# Patient Record
Sex: Female | Born: 1980 | Race: Black or African American | Hispanic: No | Smoking: Smoker, current status unknown
Health system: Southern US, Community
[De-identification: ages and names within clinical notes are randomized; demographics above are authoritative.]

## PROBLEM LIST (undated history)

## (undated) DIAGNOSIS — Z789 Other specified health status: Secondary | ICD-10-CM

## (undated) DIAGNOSIS — L0390 Cellulitis, unspecified: Secondary | ICD-10-CM

## (undated) HISTORY — PX: CHOLECYSTECTOMY: SHX55

---

## 2014-09-11 ENCOUNTER — Emergency Department: Payer: Self-pay | Admitting: Emergency Medicine

## 2015-04-22 ENCOUNTER — Encounter: Payer: Self-pay | Admitting: Emergency Medicine

## 2015-04-22 ENCOUNTER — Emergency Department: Payer: Self-pay

## 2015-04-22 ENCOUNTER — Emergency Department
Admission: EM | Admit: 2015-04-22 | Discharge: 2015-04-22 | Disposition: A | Discharge status: Home / Self Care | Payer: Self-pay | Attending: Emergency Medicine | Admitting: Emergency Medicine

## 2015-04-22 DIAGNOSIS — L03116 Cellulitis of left lower limb: Secondary | ICD-10-CM | POA: Insufficient documentation

## 2015-04-22 DIAGNOSIS — Z720 Tobacco use: Secondary | ICD-10-CM | POA: Insufficient documentation

## 2015-04-22 LAB — BASIC METABOLIC PANEL
Anion gap: 6 (ref 5–15)
BUN: 10 mg/dL (ref 6–20)
CALCIUM: 8.4 mg/dL — AB (ref 8.9–10.3)
CHLORIDE: 105 mmol/L (ref 101–111)
CO2: 25 mmol/L (ref 22–32)
Creatinine, Ser: 0.64 mg/dL (ref 0.44–1.00)
GFR calc non Af Amer: >60 mL/min (ref >60)
Glucose, Bld: 98 mg/dL (ref 65–99)
POTASSIUM: 3.8 mmol/L (ref 3.5–5.1)
Sodium: 136 mmol/L (ref 135–145)

## 2015-04-22 LAB — CBC WITH DIFFERENTIAL/PLATELET
BASOS PCT: 1 %
Basophils Absolute: 0.1 10*3/uL (ref 0–0.1)
EOS ABS: 0.4 10*3/uL (ref 0–0.7)
Eosinophils Relative: 4 %
HEMATOCRIT: 45.5 % (ref 35.0–47.0)
HEMOGLOBIN: 14.6 g/dL (ref 12.0–16.0)
Lymphocytes Relative: 17 %
Lymphs Abs: 1.5 10*3/uL (ref 1.0–3.6)
MCH: 27.6 pg (ref 26.0–34.0)
MCHC: 32.1 g/dL (ref 32.0–36.0)
MCV: 85.9 fL (ref 80.0–100.0)
MONO ABS: 0.9 10*3/uL (ref 0.2–0.9)
MONOS PCT: 10 %
NEUTROS PCT: 68 %
Neutro Abs: 6.1 10*3/uL (ref 1.4–6.5)
PLATELETS: 190 10*3/uL (ref 150–440)
RBC: 5.3 MIL/uL — ABNORMAL HIGH (ref 3.80–5.20)
RDW: 15.5 % — ABNORMAL HIGH (ref 11.5–14.5)
WBC: 9 10*3/uL (ref 3.6–11.0)

## 2015-04-22 MED ORDER — CLINDAMYCIN HCL 300 MG PO CAPS: 300.0000 mg | ORAL_CAPSULE | Freq: Four times a day (QID) | ORAL | Status: AC

## 2015-04-22 MED ORDER — CLINDAMYCIN PHOSPHATE 600 MG/50ML IV SOLN
INTRAVENOUS | Status: AC
  Administered 2015-04-22: 600 mg via INTRAVENOUS
  Filled 2015-04-22: qty 50

## 2015-04-22 MED ORDER — IBUPROFEN 800 MG PO TABS: 800.0000 mg | ORAL_TABLET | Freq: Three times a day (TID) | ORAL | Status: DC | PRN

## 2015-04-22 MED ORDER — CLINDAMYCIN PHOSPHATE 600 MG/50ML IV SOLN
600.0000 mg | Freq: Once | INTRAVENOUS | Status: AC
  Administered 2015-04-22: 600 mg via INTRAVENOUS

## 2015-04-22 NOTE — ED Provider Notes (Signed)
CSN: [id number]     Arrival date & time 04/22/15  1147 History   First MD Initiated Contact with Patient 04/22/15 1205     Chief Complaint  Patient presents with  . Leg Swelling     (Consider location/radiation/quality/duration/timing/severity/associated sxs/prior Treatment) HPI  Patient has a 4 day history of redness to her left leg states that she went to her doctor 3 days ago was placed on an antibiotic that she couldn't afford she purchased 3 pills and took one a day she notes that they're supposed to be taken much more frequently than that and says that the redness has seemed to gotten worse she denies any fevers or chills rates the pain as about a 5 out of 10 at its worst and has no other complaints at this time   No past medical history on file. Past Surgical History  Procedure Laterality Date  . Cholecystectomy     No family history on file. History  Substance Use Topics  . Smoking status: Smoker, Current Status Unknown  . Smokeless tobacco: Not on file  . Alcohol Use: Yes   OB History    No data available     Review of Systems  Constitutional: Negative.   HENT: Negative.   Eyes: Negative.   Respiratory: Negative.   Cardiovascular: Negative.   Musculoskeletal: Negative.   Neurological: Negative.   All other systems reviewed and are negative.     Allergies  Shellfish allergy  Home Medications   Prior to Admission medications   Medication Sig Start Date End Date Taking? Authorizing Provider  clindamycin (CLEOCIN) 300 MG capsule Take 1 capsule (300 mg total) by mouth 4 (four) times daily. 04/22/15   Darrel Gloss William C Tineshia Becraft, PA-C  ibuprofen (ADVIL,MOTRIN) 800 MG tablet Take 1 tablet (800 mg total) by mouth every 8 (eight) hours as needed. 04/22/15   Keirston Saephanh William C Carrera Kiesel, PA-C   BP 130/100 mmHg  Pulse 77  Temp(Src) 97.8 F (36.6 C) (Oral)  Resp 14  Ht 5\' 5"  (1.651 m)  Wt 316 lb (143.337 kg)  BMI 52.59 kg/m2  SpO2 98%  LMP 03/22/2015 Physical  Exam  Morbidly obese female appearing stated age Well-developed well-nourished and in no acute distress Vitals reviewed Head ears eyes nose neck and throat examination patient unremarkable Cardiovascular regular rate and rhythm no murmurs rubs gallops Pulmonary lungs clear to auscultation bilaterally Muscular skeletal she able to move all extremities without difficulty Skin the patient has a large warm erythematous area on her medial left calf spreading up past her knee to her lower thigh no area of fluctuance no focal abscess good distal pulses and cap refill of her left leg Neuro exams nonfocal cranial nerves II through XII grossly intact   ED Course  Procedures (including critical care time) Labs Review Labs Reviewed  BASIC METABOLIC PANEL - Abnormal; Notable for the following:    Calcium 8.4 (*)    All other components within normal limits  CBC WITH DIFFERENTIAL/PLATELET - Abnormal; Notable for the following:    RBC 5.30 (*)    RDW 15.5 (*)    All other components within normal limits  CULTURE, BLOOD (ROUTINE X 2)  CULTURE, BLOOD (ROUTINE X 2)    Imaging Review US Venous Img Lower Unilateral Left  04/22/2015   CLINICAL DATA:  Left leg swelling for 1 week. Associated pain and discoloration. No history of DVT. Initial encounter.  EXAM: LEFT LOWER EXTREMITY VENOUS DOPPLER ULTRASOUND  TECHNIQUE: Gray-scale sonography with graded compression,  as well as color Doppler and duplex ultrasound were performed to evaluate the lower extremity deep venous systems from the level of the common femoral vein and including the common femoral, femoral, profunda femoral, popliteal and calf veins including the posterior tibial, peroneal and gastrocnemius veins when visible. The superficial great saphenous vein was also interrogated. Spectral Doppler was utilized to evaluate flow at rest and with distal augmentation maneuvers in the common femoral, femoral and popliteal veins.  COMPARISON:  None.   FINDINGS: Contralateral Common Femoral Vein: Respiratory phasicity is normal and symmetric with the symptomatic side. No evidence of thrombus. Normal compressibility.  Common Femoral Vein: No evidence of thrombus. Normal compressibility, respiratory phasicity and response to augmentation.  Saphenofemoral Junction: No evidence of thrombus. Normal compressibility and flow on color Doppler imaging.  Profunda Femoral Vein: No evidence of thrombus. Normal compressibility and flow on color Doppler imaging.  Femoral Vein: No evidence of thrombus. Normal compressibility, respiratory phasicity and response to augmentation.  Popliteal Vein: No evidence of thrombus. Normal compressibility, respiratory phasicity and response to augmentation.  Calf Veins: Limited evaluation.  No gross abnormality.  Superficial Great Saphenous Vein: No evidence of thrombus. Normal compressibility and flow on color Doppler imaging.  Other Findings:  None.  IMPRESSION: No evidence of acute DVT in the left lower extremity. Examination is mildly limited by body habitus.   Electronically Signed   By: Carey Bullocks M.D.   On: 04/22/2015 13:48   ED course patient was given 600 mg clindamycin IV labs and ultrasound were reviewed with the patient   MDM   Decision making on this patient given that there was no sign of any thrombus in her leg will go and started on IV antibiotic's get her an affordable oral antibiotic have her return in 2 days for wound check return here earlier for any acute concerns or worsening symptoms   Final diagnoses:  Cellulitis of left lower extremity       Alahia Whicker Rosalyn Gess, PA-C 04/22/15 1507  Governor Rooks, MD 04/22/15 1520

## 2015-04-22 NOTE — ED Notes (Signed)
Pt states last Friday she noticed pain in her left lower leg, calf, area, was seen at her primary dr and prescribed antibiotics, pt has pain that radiates to her inner left thigh, with redness and swelling

## 2015-04-22 NOTE — ED Notes (Signed)
Inflamed area left thigh marked with skin marker

## 2015-04-23 ENCOUNTER — Encounter: Payer: Self-pay | Admitting: Emergency Medicine

## 2015-04-23 DIAGNOSIS — Z79899 Other long term (current) drug therapy: Secondary | ICD-10-CM

## 2015-04-23 DIAGNOSIS — E876 Hypokalemia: Secondary | ICD-10-CM | POA: Diagnosis present

## 2015-04-23 DIAGNOSIS — I809 Phlebitis and thrombophlebitis of unspecified site: Principal | ICD-10-CM | POA: Diagnosis present

## 2015-04-23 DIAGNOSIS — Z6841 Body Mass Index (BMI) 40.0 and over, adult: Secondary | ICD-10-CM

## 2015-04-23 DIAGNOSIS — L03116 Cellulitis of left lower limb: Secondary | ICD-10-CM | POA: Diagnosis present

## 2015-04-23 DIAGNOSIS — I8390 Asymptomatic varicose veins of unspecified lower extremity: Secondary | ICD-10-CM | POA: Diagnosis present

## 2015-04-23 DIAGNOSIS — Z9049 Acquired absence of other specified parts of digestive tract: Secondary | ICD-10-CM | POA: Diagnosis present

## 2015-04-23 DIAGNOSIS — Z833 Family history of diabetes mellitus: Secondary | ICD-10-CM

## 2015-04-23 DIAGNOSIS — F17200 Nicotine dependence, unspecified, uncomplicated: Secondary | ICD-10-CM | POA: Diagnosis present

## 2015-04-23 NOTE — ED Notes (Signed)
Was seen here Saturday, dx'd with cellulitis.  On clindamycin, area marked.  Was told to come back if redness crossed line.,  Redness has crossed the line and she is concerned.  Has breached line by 1-1.5 inches in areas.  No open areas, denies fever and chills.  NAD noted.  slighty tachy at 105.  Afebrile at triage.

## 2015-04-24 ENCOUNTER — Inpatient Hospital Stay
Admission: EM | Admit: 2015-04-24 | Discharge: 2015-04-27 | DRG: 300 | Disposition: A | Discharge status: Home / Self Care | Payer: Medicaid Other | Attending: Internal Medicine | Admitting: Internal Medicine

## 2015-04-24 ENCOUNTER — Encounter: Payer: Self-pay | Admitting: Emergency Medicine

## 2015-04-24 DIAGNOSIS — Z720 Tobacco use: Secondary | ICD-10-CM

## 2015-04-24 DIAGNOSIS — L03116 Cellulitis of left lower limb: Secondary | ICD-10-CM | POA: Diagnosis present

## 2015-04-24 DIAGNOSIS — E876 Hypokalemia: Secondary | ICD-10-CM | POA: Diagnosis present

## 2015-04-24 DIAGNOSIS — Z95828 Presence of other vascular implants and grafts: Secondary | ICD-10-CM

## 2015-04-24 DIAGNOSIS — E669 Obesity, unspecified: Secondary | ICD-10-CM

## 2015-04-24 DIAGNOSIS — I809 Phlebitis and thrombophlebitis of unspecified site: Secondary | ICD-10-CM

## 2015-04-24 HISTORY — DX: Other specified health status: Z78.9

## 2015-04-24 HISTORY — DX: Cellulitis, unspecified: L03.90

## 2015-04-24 LAB — CBC
HCT: 41.9 % (ref 35.0–47.0)
Hemoglobin: 14.1 g/dL (ref 12.0–16.0)
MCH: 28.5 pg (ref 26.0–34.0)
MCHC: 33.6 g/dL (ref 32.0–36.0)
MCV: 84.9 fL (ref 80.0–100.0)
PLATELETS: 198 10*3/uL (ref 150–440)
RBC: 4.94 MIL/uL (ref 3.80–5.20)
RDW: 15.4 % — ABNORMAL HIGH (ref 11.5–14.5)
WBC: 11.5 10*3/uL — ABNORMAL HIGH (ref 3.6–11.0)

## 2015-04-24 LAB — COMPREHENSIVE METABOLIC PANEL
ALK PHOS: 77 U/L (ref 38–126)
ALT: 24 U/L (ref 14–54)
AST: 25 U/L (ref 15–41)
Albumin: 3.7 g/dL (ref 3.5–5.0)
Anion gap: 5 (ref 5–15)
BUN: 14 mg/dL (ref 6–20)
CALCIUM: 8.8 mg/dL — AB (ref 8.9–10.3)
CO2: 29 mmol/L (ref 22–32)
Chloride: 105 mmol/L (ref 101–111)
Creatinine, Ser: 0.76 mg/dL (ref 0.44–1.00)
GFR calc Af Amer: >60 mL/min (ref >60)
GFR calc non Af Amer: >60 mL/min (ref >60)
Glucose, Bld: 85 mg/dL (ref 65–99)
POTASSIUM: 3.4 mmol/L — AB (ref 3.5–5.1)
Sodium: 139 mmol/L (ref 135–145)
TOTAL PROTEIN: 7.6 g/dL (ref 6.5–8.1)
Total Bilirubin: 0.6 mg/dL (ref 0.3–1.2)

## 2015-04-24 MED ORDER — POTASSIUM CHLORIDE CRYS ER 20 MEQ PO TBCR
20.0000 meq | EXTENDED_RELEASE_TABLET | Freq: Once | ORAL | Status: DC
  Filled 2015-04-24: qty 1

## 2015-04-24 MED ORDER — OXYCODONE-ACETAMINOPHEN 5-325 MG PO TABS
ORAL_TABLET | ORAL | Status: AC
  Filled 2015-04-24: qty 1

## 2015-04-24 MED ORDER — ONDANSETRON HCL 4 MG/2ML IJ SOLN: 4.0000 mg | Freq: Four times a day (QID) | INTRAMUSCULAR | Status: DC | PRN

## 2015-04-24 MED ORDER — VANCOMYCIN HCL 10 G IV SOLR
1500.0000 mg | Freq: Two times a day (BID) | INTRAVENOUS | Status: DC
  Filled 2015-04-24 (×3): qty 1500

## 2015-04-24 MED ORDER — ONDANSETRON HCL 4 MG PO TABS: 4.0000 mg | ORAL_TABLET | Freq: Four times a day (QID) | ORAL | Status: DC | PRN

## 2015-04-24 MED ORDER — ACETAMINOPHEN 325 MG PO TABS: 650.0000 mg | ORAL_TABLET | Freq: Four times a day (QID) | ORAL | Status: DC | PRN

## 2015-04-24 MED ORDER — SODIUM CHLORIDE 0.9 % IJ SOLN
3.0000 mL | INTRAMUSCULAR | Status: DC | PRN
  Administered 2015-04-25: 3 mL via INTRAVENOUS
  Filled 2015-04-24: qty 10

## 2015-04-24 MED ORDER — SODIUM CHLORIDE 0.9 % IJ SOLN
3.0000 mL | Freq: Two times a day (BID) | INTRAMUSCULAR | Status: DC
  Administered 2015-04-24 – 2015-04-27 (×7): 3 mL via INTRAVENOUS

## 2015-04-24 MED ORDER — IBUPROFEN 800 MG PO TABS
800.0000 mg | ORAL_TABLET | Freq: Three times a day (TID) | ORAL | Status: DC | PRN
  Administered 2015-04-24: 800 mg via ORAL
  Filled 2015-04-24: qty 1

## 2015-04-24 MED ORDER — CEFAZOLIN SODIUM-DEXTROSE 2-3 GM-% IV SOLR
2.0000 g | Freq: Three times a day (TID) | INTRAVENOUS | Status: DC
  Administered 2015-04-24 – 2015-04-26 (×6): 2 g via INTRAVENOUS
  Filled 2015-04-24 (×10): qty 50

## 2015-04-24 MED ORDER — VANCOMYCIN HCL IN DEXTROSE 1-5 GM/200ML-% IV SOLN
INTRAVENOUS | Status: AC
  Filled 2015-04-24: qty 200

## 2015-04-24 MED ORDER — ENOXAPARIN SODIUM 40 MG/0.4ML ~~LOC~~ SOLN
40.0000 mg | Freq: Two times a day (BID) | SUBCUTANEOUS | Status: DC
  Administered 2015-04-24 – 2015-04-27 (×7): 40 mg via SUBCUTANEOUS
  Filled 2015-04-24 (×7): qty 0.4

## 2015-04-24 MED ORDER — SODIUM CHLORIDE 0.9 % IV SOLN: 250.0000 mL | INTRAVENOUS | Status: DC | PRN

## 2015-04-24 MED ORDER — VANCOMYCIN HCL IN DEXTROSE 1-5 GM/200ML-% IV SOLN
1000.0000 mg | Freq: Once | INTRAVENOUS | Status: AC
  Administered 2015-04-24: 1000 mg via INTRAVENOUS
  Filled 2015-04-24: qty 200

## 2015-04-24 MED ORDER — ACETAMINOPHEN 650 MG RE SUPP: 650.0000 mg | Freq: Four times a day (QID) | RECTAL | Status: DC | PRN

## 2015-04-24 MED ORDER — OXYCODONE HCL 5 MG PO TABS
5.0000 mg | ORAL_TABLET | ORAL | Status: DC | PRN
  Administered 2015-04-24 – 2015-04-27 (×11): 5 mg via ORAL
  Filled 2015-04-24 (×11): qty 1

## 2015-04-24 MED ORDER — OXYCODONE-ACETAMINOPHEN 5-325 MG PO TABS
1.0000 | ORAL_TABLET | Freq: Once | ORAL | Status: AC
  Administered 2015-04-24: 1 via ORAL

## 2015-04-24 NOTE — ED Notes (Signed)
MD at bedside. 

## 2015-04-24 NOTE — Progress Notes (Signed)
Let nurse know 

## 2015-04-24 NOTE — Progress Notes (Signed)
Patient ID: Kathryn Norris, female   DOB: 06-Dec-1980, 34 y.o.   MRN: [id number] [facility] PROGRESS NOTE  HPI/Subjective: Patient with left leg pain. Redness going on for 5 days. Was put on oral clindamycin and sent home and came back with worsening leg redness. Not sure if it started off as a bug bite or not.  Objective: Filed Vitals:   04/24/15 0806  BP: 100/60  Pulse:   Temp:   Resp:     Intake/Output Summary (Last 24 hours) at 04/24/15 1000 Last data filed at 04/24/15 0749  Gross per 24 hour  Intake    240 ml  Output      0 ml  Net    240 ml   Filed Weights   04/23/15 2303 04/24/15 0628  Weight: 143.337 kg (316 lb) 144.017 kg (317 lb 8 oz)    ROS: Review of Systems  Constitutional: Negative for fever and chills.  Eyes: Negative for blurred vision.  Respiratory: Negative for cough and shortness of breath.   Cardiovascular: Negative for chest pain.  Gastrointestinal: Negative for nausea, vomiting, abdominal pain, diarrhea and constipation.  Genitourinary: Negative for dysuria.  Musculoskeletal: Positive for myalgias. Negative for joint pain.  Neurological: Negative for dizziness and headaches.   Exam: Physical Exam  HENT:  Nose: No mucosal edema.  Mouth/Throat: No oropharyngeal exudate or posterior oropharyngeal edema.  Eyes: Conjunctivae, EOM and lids are normal. Pupils are equal, round, and reactive to light.  Neck: No JVD present. Carotid bruit is not present. No edema present. No thyroid mass and no thyromegaly present.  Cardiovascular: S1 normal and S2 normal.  Exam reveals no gallop.   No murmur heard. Pulses:      Dorsalis pedis pulses are 2+ on the right side, and 2+ on the left side.  Respiratory: No respiratory distress. She has no wheezes. She has no rhonchi. She has no rales.  GI: Soft. Bowel sounds are normal. There is no tenderness.  Musculoskeletal:       Right ankle: She exhibits swelling.       Left ankle: She exhibits swelling.   Lymphadenopathy:    She has no cervical adenopathy.  Neurological: She is alert. No cranial nerve deficit.  Skin: Skin is warm. Rash noted. Nails show no clubbing.  Erythema left calf extending up into the left thigh area medially. Looks like a potential bug bite on left leg area. Some fullness and some tenderness over the veins.  Psychiatric: She has a normal mood and affect.   Data Reviewed: Basic Metabolic Panel:  Recent Labs Lab 04/22/15 1234 04/24/15 0352  NA 136 139  K 3.8 3.4*  CL 105 105  CO2 25 29  GLUCOSE 98 85  BUN 10 14  CREATININE 0.64 0.76  CALCIUM 8.4* 8.8*   Liver Function Tests:  Recent Labs Lab 04/24/15 0352  AST 25  ALT 24  ALKPHOS 77  BILITOT 0.6  PROT 7.6  ALBUMIN 3.7   CBC:  Recent Labs Lab 04/22/15 1234 04/24/15 0352  WBC 9.0 11.5*  NEUTROABS 6.1  --   HGB 14.6 14.1  HCT 45.5 41.9  MCV 85.9 84.9  PLT 190 198    Recent Results (from the past 240 hour(s))  Culture, blood (routine x 2)     Status: None (Preliminary result)   Collection Time: 04/22/15 12:34 PM  Result Value Ref Range Status   Specimen Description BLOOD  Final   Special Requests BLOOD  Final   Culture  NO GROWTH 2 DAYS  Final   Report Status PENDING  Incomplete  Culture, blood (routine x 2)     Status: None (Preliminary result)   Collection Time: 04/22/15 12:34 PM  Result Value Ref Range Status   Specimen Description BLOOD  Final   Special Requests NONE  Final   Culture NO GROWTH 2 DAYS  Final   Report Status PENDING  Incomplete     Studies: US Venous Img Lower Unilateral Left  04/22/2015   CLINICAL DATA:  Left leg swelling for 1 week. Associated pain and discoloration. No history of DVT. Initial encounter.  EXAM: LEFT LOWER EXTREMITY VENOUS DOPPLER ULTRASOUND  TECHNIQUE: Gray-scale sonography with graded compression, as well as color Doppler and duplex ultrasound were performed to evaluate the lower extremity deep venous systems from the level of the common  femoral vein and including the common femoral, femoral, profunda femoral, popliteal and calf veins including the posterior tibial, peroneal and gastrocnemius veins when visible. The superficial great saphenous vein was also interrogated. Spectral Doppler was utilized to evaluate flow at rest and with distal augmentation maneuvers in the common femoral, femoral and popliteal veins.  COMPARISON:  None.  FINDINGS: Contralateral Common Femoral Vein: Respiratory phasicity is normal and symmetric with the symptomatic side. No evidence of thrombus. Normal compressibility.  Common Femoral Vein: No evidence of thrombus. Normal compressibility, respiratory phasicity and response to augmentation.  Saphenofemoral Junction: No evidence of thrombus. Normal compressibility and flow on color Doppler imaging.  Profunda Femoral Vein: No evidence of thrombus. Normal compressibility and flow on color Doppler imaging.  Femoral Vein: No evidence of thrombus. Normal compressibility, respiratory phasicity and response to augmentation.  Popliteal Vein: No evidence of thrombus. Normal compressibility, respiratory phasicity and response to augmentation.  Calf Veins: Limited evaluation.  No gross abnormality.  Superficial Great Saphenous Vein: No evidence of thrombus. Normal compressibility and flow on color Doppler imaging.  Other Findings:  None.  IMPRESSION: No evidence of acute DVT in the left lower extremity. Examination is mildly limited by body habitus.   Electronically Signed   By: Carey Bullocks M.D.   On: 04/22/2015 13:48    Scheduled Meds: .  ceFAZolin (ANCEF) IV  2 g Intravenous 3 times per day  . enoxaparin (LOVENOX) injection  40 mg Subcutaneous Q12H  . oxyCODONE-acetaminophen      . potassium chloride  20 mEq Oral Once  . sodium chloride  3 mL Intravenous Q12H  . vancomycin       Continuous Infusions:   Assessment/Plan:  1. Cellulitis left lower extremity with leukocytosis. Failed outpatient treatment with  clindamycin. I will switch antibiotics to IV Ancef and DC vancomycin. Continue to watch on a daily basis for potential discharge. Some fullness over the veins - ultrasound showed no DVT. When necessary pain medication. I advised her to elevate her leg while she is in the bed. 2. Hypokalemia- replace this morning. 3. Tobacco abuse 4. Obesity- weight loss needed.  Code Status:     Code Status Orders        Start     Ordered   04/24/15 0631  Full code   Continuous     04/24/15 0630     Disposition Plan: Home once cellulitis improves  Time spent: 30 minutes.  Alford Highland  [facility]

## 2015-04-24 NOTE — Progress Notes (Signed)
ANTIBIOTIC CONSULT NOTE - INITIAL  Pharmacy Consult for Vancomycin Indication: Cellulitis  Allergies  Allergen Reactions  . Shellfish Allergy Itching    Patient Measurements: Height: 5\' 5"  (165.1 cm) Weight: (!) 316 lb (143.337 kg) IBW/kg (Calculated) : 57 Adjusted Body Weight: 91.5 kg  Vital Signs: Temp: 98.7 F (37.1 C) (06/12 2303) Temp Source: Oral (06/12 2303) BP: 116/78 mmHg (06/13 0335) Pulse Rate: 86 (06/13 0335) Intake/Output from previous day:   Intake/Output from this shift:    Labs:  Recent Labs  04/22/15 1234 04/24/15 0352  WBC 9.0 11.5*  HGB 14.6 14.1  PLT 190 198  CREATININE 0.64 0.76   Estimated Creatinine Clearance: 141.8 mL/min (by C-G formula based on Cr of 0.76). No results for input(s): VANCOTROUGH, VANCOPEAK, VANCORANDOM, GENTTROUGH, GENTPEAK, GENTRANDOM, TOBRATROUGH, TOBRAPEAK, TOBRARND, AMIKACINPEAK, AMIKACINTROU, AMIKACIN in the last 72 hours.   Kinetics:   Ke: 0.096 Vd: 54   Microbiology: Recent Results (from the past 720 hour(s))  Culture, blood (routine x 2)     Status: None (Preliminary result)   Collection Time: 04/22/15 12:34 PM  Result Value Ref Range Status   Specimen Description BLOOD  Final   Special Requests BLOOD  Final   Culture NO GROWTH < 24 HOURS  Final   Report Status PENDING  Incomplete  Culture, blood (routine x 2)     Status: None (Preliminary result)   Collection Time: 04/22/15 12:34 PM  Result Value Ref Range Status   Specimen Description BLOOD  Final   Special Requests NONE  Final   Culture NO GROWTH < 24 HOURS  Final   Report Status PENDING  Incomplete    Medical History: Past Medical History  Diagnosis Date  . Cellulitis   . Medical history non-contributory     Medications:  Scheduled:  . oxyCODONE-acetaminophen       Infusions:  . vancomycin    . vancomycin     PRN:  Anti-infectives    Start     Dose/Rate Route Frequency Ordered Stop   04/24/15 1230  vancomycin (VANCOCIN) 1,500 mg in  sodium chloride 0.9 % 500 mL IVPB     1,500 mg 250 mL/hr over 120 Minutes Intravenous Every 12 hours 04/24/15 0523     04/24/15 0356  vancomycin (VANCOCIN) 1 GM/200ML IVPB    Comments:  Perlie Mayo: cabinet override      04/24/15 0356 04/24/15 1559   04/24/15 0330  vancomycin (VANCOCIN) IVPB 1000 mg/200 mL premix     1,000 mg 200 mL/hr over 60 Minutes Intravenous  Once 04/24/15 0325 04/24/15 0523     Assessment: Patient with LE cellulitis not responding to oral abx per MD note to begin vancomycin.   Goal of Therapy:  Vancomycin trough level 10-15 mcg/ml  Plan:  Vancomycin 1000 mg iv once then 1500 mg iv q 12 h with stacked dosing and a trough with the 4th dose. Patient is at risk for accumulation so will follow renal function closely and check levels as indicated.   Luisa Hart D 04/24/2015,5:24 AM

## 2015-04-24 NOTE — H&P (Signed)
[facility]icians - [place] at [facility]   PATIENT NAME: Kathryn Norris    MR#:  [id number]  DATE OF BIRTH:  [birth year]  DATE OF ADMISSION:  04/24/2015  PRIMARY CARE PHYSICIAN: Pcp Not In System   REQUESTING/REFERRING PHYSICIAN: Margaret Pyle  CHIEF COMPLAINT:   Chief Complaint  Patient presents with  . Cellulitis   redness pain and swelling of left lower extremity of 5 days duration  HISTORY OF PRESENT ILLNESS:  Kathryn Norris  is a 34 y.o. female with no significant past medical history presents to the emergency room with the complaints of increased redness and swelling and pain of left lower extremity. Patient states she developed redness, swelling and pain of left lower exudate 5 days ago which gradually worsened hence came to the emergency room yesterday for evaluation. Patient was evaluated by the ED physician yesterday, had a DVT studies done which were negative for DVT and was diagnosed to have left lower extremity cellulitis, was given 1 dose of IV clindamycin and sent home on oral clindamycin and ibuprofen and was advised to monitor closely and return to ER if she notices increased swelling. Patient noticed increased swelling and redness with increased pain of left lower extremity hence return to the emergency room today for reevaluation. No history of fever or chills, no chest pain, shortness of breath, nausea, vomiting, diarrhea, dysuria. Patient was noted by the ED physician to have spreading cellulitis of left lower extremity compared to yesterday's examination. She was found to be with stable vital signs and afebrile. Workup revealed elevated white blood cell count of 11.5 and potassium 3.4. Patient was started on IV vancomycin and hospitalist service was consulted for further management. Patient did have blood cultures drawn yesterday and  no growth is reported as of today.  PAST MEDICAL HISTORY:   Past Medical History  Diagnosis Date  . Cellulitis   . Medical  history non-contributory     PAST SURGICAL HISTORY:   Past Surgical History  Procedure Laterality Date  . Cholecystectomy      SOCIAL HISTORY:   History  Substance Use Topics  . Smoking status: Smoker, Current Status Unknown  . Smokeless tobacco: Not on file  . Alcohol Use: Yes    FAMILY HISTORY:   Family History  Problem Relation Age of Onset  . Diabetes Mother     DRUG ALLERGIES:   Allergies  Allergen Reactions  . Shellfish Allergy Itching    REVIEW OF SYSTEMS:   Review of Systems  Constitutional: Negative for fever, chills and malaise/fatigue.  HENT: Negative for ear pain, hearing loss, nosebleeds, sore throat and tinnitus.   Eyes: Negative for blurred vision, double vision, pain, discharge and redness.  Respiratory: Negative for cough, hemoptysis, sputum production, shortness of breath and wheezing.   Cardiovascular: Negative for chest pain, palpitations, orthopnea and leg swelling.  Gastrointestinal: Negative for nausea, vomiting, abdominal pain, diarrhea, constipation, blood in stool and melena.  Genitourinary: Negative for dysuria, urgency, frequency and hematuria.  Musculoskeletal: Negative for back pain, joint pain and neck pain.  Skin: Positive for rash. Negative for itching.       Erythema over medial aspect of left lower extremity extending from mid calf to mid thigh, warm to touch and tender on palpation.  Neurological: Negative for dizziness, tingling, sensory change, focal weakness and seizures.  Endo/Heme/Allergies: Does not bruise/bleed easily.  Psychiatric/Behavioral: Negative for depression. The patient is not nervous/anxious.     MEDICATIONS AT HOME:   Prior to Admission  medications   Medication Sig Start Date End Date Taking? Authorizing Provider  clindamycin (CLEOCIN) 300 MG capsule Take 1 capsule (300 mg total) by mouth 4 (four) times daily. 04/22/15  Yes III Kristine Garbe Ruffian, PA-C  ibuprofen (ADVIL,MOTRIN) 800 MG tablet Take 1 tablet  (800 mg total) by mouth every 8 (eight) hours as needed. 04/22/15  Yes III Rosalyn Gess, PA-C      VITAL SIGNS:  Blood pressure 116/78, pulse 86, temperature 98.7 F (37.1 C), temperature source Oral, resp. rate 18, height  (1.651 m), weight 143.337 kg (316 lb), last menstrual period 03/22/2015, SpO2 100 %.  PHYSICAL EXAMINATION:  Physical Exam  Constitutional: She is oriented to person, place, and time. She appears well-developed and well-nourished. No distress.  HENT:  Head: Normocephalic and atraumatic.  Right Ear: External ear normal.  Left Ear: External ear normal.  Nose: Nose normal.  Mouth/Throat: Oropharynx is clear and moist. No oropharyngeal exudate.  Eyes: EOM are normal. Pupils are equal, round, and reactive to light. No scleral icterus.  Neck: Normal range of motion. Neck supple. No JVD present. No thyromegaly present.  Cardiovascular: Normal rate, regular rhythm, normal heart sounds and intact distal pulses.  Exam reveals no friction rub.   No murmur heard. Respiratory: Effort normal and breath sounds normal. No respiratory distress. She has no wheezes. She has no rales. She exhibits no tenderness.  GI: Soft. Bowel sounds are normal. She exhibits no distension and no mass. There is no tenderness. There is no rebound and no guarding.  Musculoskeletal: Normal range of motion. She exhibits no edema.  Lymphadenopathy:    She has no cervical adenopathy.  Neurological: She is alert and oriented to person, place, and time. She has normal reflexes. She displays normal reflexes. No cranial nerve deficit. She exhibits normal muscle tone.  Skin: Skin is warm. No rash noted. There is erythema.  Erythema over the medial aspect of left lower extremity extending from mid calf to mid thigh with increased local warmth and tender to palpation  Psychiatric: She has a normal mood and affect. Her behavior is normal. Thought content normal.   LABORATORY PANEL:   CBC  Recent  Labs Lab 04/24/15 0352  WBC 11.5*  HGB 14.1  HCT 41.9  PLT 198   ------------------------------------------------------------------------------------------------------------------  Chemistries   Recent Labs Lab 04/24/15 0352  NA 139  K 3.4*  CL 105  CO2 29  GLUCOSE 85  BUN 14  CREATININE 0.76  CALCIUM 8.8*  AST 25  ALT 24  ALKPHOS 77  BILITOT 0.6   ------------------------------------------------------------------------------------------------------------------  Cardiac Enzymes No results for input(s): TROPONINI in the last 168 hours. ------------------------------------------------------------------------------------------------------------------  RADIOLOGY:  US Venous Img Lower Unilateral Left  04/22/2015   CLINICAL DATA:  Left leg swelling for 1 week. Associated pain and discoloration. No history of DVT. Initial encounter.  EXAM: LEFT LOWER EXTREMITY VENOUS DOPPLER ULTRASOUND  TECHNIQUE: Gray-scale sonography with graded compression, as well as color Doppler and duplex ultrasound were performed to evaluate the lower extremity deep venous systems from the level of the common femoral vein and including the common femoral, femoral, profunda femoral, popliteal and calf veins including the posterior tibial, peroneal and gastrocnemius veins when visible. The superficial great saphenous vein was also interrogated. Spectral Doppler was utilized to evaluate flow at rest and with distal augmentation maneuvers in the common femoral, femoral and popliteal veins.  COMPARISON:  None.  FINDINGS: Contralateral Common Femoral Vein: Respiratory phasicity is normal and symmetric with the  symptomatic side. No evidence of thrombus. Normal compressibility.  Common Femoral Vein: No evidence of thrombus. Normal compressibility, respiratory phasicity and response to augmentation.  Saphenofemoral Junction: No evidence of thrombus. Normal compressibility and flow on color Doppler imaging.  Profunda  Femoral Vein: No evidence of thrombus. Normal compressibility and flow on color Doppler imaging.  Femoral Vein: No evidence of thrombus. Normal compressibility, respiratory phasicity and response to augmentation.  Popliteal Vein: No evidence of thrombus. Normal compressibility, respiratory phasicity and response to augmentation.  Calf Veins: Limited evaluation.  No gross abnormality.  Superficial Great Saphenous Vein: No evidence of thrombus. Normal compressibility and flow on color Doppler imaging.  Other Findings:  None.  IMPRESSION: No evidence of acute DVT in the left lower extremity. Examination is mildly limited by body habitus.   Electronically Signed   By: Carey Bullocks M.D.   On: 04/22/2015 13:48    EKG:  No orders found for this or any previous visit.  IMPRESSION AND PLAN:   1. Left lower extremity cellulitis, worsening-not responding to oral antibiotics. Plan: Admit to MedSurg, continue IV vancomycin, elevation of left lower extremity, pain control meds, follow-up blood cultures. 2. Hypokalemia, mild. Replace potassium, follow-up BMP. 3. Tobacco usage, continuous. Counseled to quit. Patient not motivated to quit at this time.    All the records are reviewed and case discussed with ED provider. Management plans discussed with the patient, family and they are in agreement.  CODE STATUS: Full code  TOTAL TIME TAKING CARE OF THIS PATIENT: 45 minutes.    Crissie Figures M.D on 04/24/2015 at 5:10 AM  Between 7am to 6pm - Pager - [phone]  After 6pm go to [email or web address] - password [email or web address]  [facility]  Office  [phone]  CC: Primary care physician; Pcp Not In System

## 2015-04-24 NOTE — ED Provider Notes (Signed)
[facility] Emergency Department Provider Note  ____________________________________________  Time seen: Approximately 3:00 AM  I have reviewed the triage vital signs and the nursing notes.   HISTORY  Chief Complaint Cellulitis    HPI ERCIE Norris is a 34 y.o. female who was here yesterday and diagnosed with cellulitis to her left leg. The patient reports that there was a line drawn around her leg and she was told to return if there was spreading past the line of cellulitis. The patient reports that she noticed the spreading up her leg as well even after taking multiple doses of the medication was given to her so she came back into the hospital. The patient reports that she saw her physician 5 days ago and was initially prescribed clindamycin. She reports that initially she could not afford the medication so she only got a few pills and took one pill. She reports yesterday she came in and received a dose of IV clindamycin as well as 3 oral doses yesterday and for oral doses today but the redness is still spreading. The patient also reports that she is having some significant pain which is 8 out of 10 on the pain scale. The patient denies any known injury. She has not had any fevers any nausea or vomiting. The patient reports that she came in to be evaluated for this cellulitis and likely admitted.   Past Medical History  Diagnosis Date  . Cellulitis     There are no active problems to display for this patient.   Past Surgical History  Procedure Laterality Date  . Cholecystectomy      Current Outpatient Rx  Name  Route  Sig  Dispense  Refill  . clindamycin (CLEOCIN) 300 MG capsule   Oral   Take 1 capsule (300 mg total) by mouth 4 (four) times daily.   40 capsule   0   . ibuprofen (ADVIL,MOTRIN) 800 MG tablet   Oral   Take 1 tablet (800 mg total) by mouth every 8 (eight) hours as needed.   30 tablet   0     Allergies Shellfish allergy  No  family history on file.  Social History History  Substance Use Topics  . Smoking status: Smoker, Current Status Unknown  . Smokeless tobacco: Not on file  . Alcohol Use: Yes    Review of Systems Constitutional: No fever/chills Eyes: No visual changes. ENT: No sore throat. Cardiovascular: Denies chest pain. Respiratory: Denies shortness of breath. Gastrointestinal: No abdominal pain.  No nausea, no vomiting.   Genitourinary: Negative for dysuria. Musculoskeletal: Negative for back pain. Skin: Erythema to left leg Neurological: Negative for headaches, focal weakness or numbness. 10-point ROS otherwise negative.  ____________________________________________   PHYSICAL EXAM:  VITAL SIGNS: ED Triage Vitals  Enc Vitals Group     BP 04/23/15 2303 134/78 mmHg     Pulse Rate 04/23/15 2303 100     Resp 04/23/15 2303 18     Temp 04/23/15 2303 98.7 F (37.1 C)     Temp Source 04/23/15 2303 Oral     SpO2 04/23/15 2303 99 %     Weight 04/23/15 2303 316 lb (143.337 kg)     Height 04/23/15 2303  (1.651 m)     Head Cir --      Peak Flow --      Pain Score 04/23/15 2303 8     Pain Loc --      Pain Edu? --  Excl. in GC? --     Constitutional: Alert and oriented. Well appearing and in mild distress. Eyes: Conjunctivae are normal. PERRL. EOMI. Head: Atraumatic. Nose: No congestion/rhinnorhea. Mouth/Throat: Mucous membranes are moist.  Oropharynx non-erythematous. Cardiovascular: Normal rate, regular rhythm. Grossly normal heart sounds.  Good peripheral circulation. Respiratory: Normal respiratory effort.  No retractions. Lungs CTAB. Gastrointestinal: Soft and nontender. No distention. Positive Bowel sounds Genitourinary: Deferred Musculoskeletal: No lower extremity tenderness nor edema.  No joint effusions. Neurologic:  Normal speech and language. No gross focal neurologic deficits are appreciated.  Skin:  Erythema to medial left lower extremity from mid calf to mid  thigh warm to touch and tender to palpation Psychiatric: Mood and affect are normal.   ____________________________________________   LABS (all labs ordered are listed, but only abnormal results are displayed)  Labs Reviewed  CBC - Abnormal; Notable for the following:    WBC 11.5 (*)    RDW 15.4 (*)    All other components within normal limits  COMPREHENSIVE METABOLIC PANEL   ____________________________________________  EKG  None ____________________________________________  RADIOLOGY  None ____________________________________________   PROCEDURES  Procedure(s) performed: None  Critical Care performed: No  ____________________________________________   INITIAL IMPRESSION / ASSESSMENT AND PLAN / ED COURSE  Pertinent labs & imaging results that were available during my care of the patient were reviewed by me and considered in my medical decision making (see chart for details).  This is a 34 year old female who comes in today with worsening cellulitis after being seen in the emergency department yesterday and try living outpatient antibiotics. I will give the patient a dose of vancomycin IV 1 and admit her to the hospital. ____________________________________________   FINAL CLINICAL IMPRESSION(S) / ED DIAGNOSES  Final diagnoses:  Cellulitis of left lower extremity      Rebecka Apley, MD 04/24/15 [phone]

## 2015-04-24 NOTE — Care Management Note (Signed)
Case Management Note  Patient Details  Name: PARTICIA HORI MRN: [id number] Date of Birth: 1981-10-08  Subjective/Objective:      34yo Ms Nuala Woehrle was admitted 04/24/15 per LLE cellulitis.  PCP is [facility] in [place], [place]. Verified Ms Sar's [place] address and phone number as listed on chart Facesheet. Pharmacy= [facility] in [place]. No home assistive equipment. Resides with her two minor children but has a sister, Judithann Sauger Ph [phone], who can assist with transportation. Discussed choice of home health providers if needed after hospital discharge. Ms Desfosses chose [facility] ph: [phone], because "I live right [place] the city limits [place]."               Action/Plan:   Expected Discharge Date:                  Expected Discharge Plan:  Home w Home Health Services  In-House Referral:  NA  Discharge planning Services  CM Consult  Post Acute Care Choice:    Choice offered to:  Patient  DME Arranged:  N/A DME Agency:     HH Arranged:    HH Agency:  [facility]  Status of Service:  In process, will continue to follow  Medicare Important Message Given:    Date Medicare IM Given:    Medicare IM give by:    Date Additional Medicare IM Given:    Additional Medicare Important Message give by:     If discussed at Long Length of Stay Meetings, dates discussed:    Additional Comments:  Brixon Zhen A, RN 04/24/2015, 11:26 AM

## 2015-04-24 NOTE — ED Notes (Signed)
Patient was seen here Saturday and diagnosed with cellulitis to left upper leg. Patient was told to come back if the cellulitis spread. Patient with some redness passed the marked area.

## 2015-04-25 ENCOUNTER — Inpatient Hospital Stay: Payer: Medicaid Other

## 2015-04-25 LAB — BASIC METABOLIC PANEL
ANION GAP: 6 (ref 5–15)
BUN: 12 mg/dL (ref 6–20)
CALCIUM: 8.2 mg/dL — AB (ref 8.9–10.3)
CO2: 27 mmol/L (ref 22–32)
Chloride: 104 mmol/L (ref 101–111)
Creatinine, Ser: 0.66 mg/dL (ref 0.44–1.00)
GFR calc non Af Amer: >60 mL/min (ref >60)
Glucose, Bld: 95 mg/dL (ref 65–99)
Potassium: 3.8 mmol/L (ref 3.5–5.1)
Sodium: 137 mmol/L (ref 135–145)

## 2015-04-25 LAB — CBC
HCT: 40.2 % (ref 35.0–47.0)
HEMOGLOBIN: 12.8 g/dL (ref 12.0–16.0)
MCH: 27.2 pg (ref 26.0–34.0)
MCHC: 31.9 g/dL — AB (ref 32.0–36.0)
MCV: 85.2 fL (ref 80.0–100.0)
Platelets: 188 10*3/uL (ref 150–440)
RBC: 4.72 MIL/uL (ref 3.80–5.20)
RDW: 15.4 % — ABNORMAL HIGH (ref 11.5–14.5)
WBC: 8.3 10*3/uL (ref 3.6–11.0)

## 2015-04-25 NOTE — Plan of Care (Signed)
Problem: Discharge Progression Outcomes Goal: Pain controlled with appropriate interventions Outcome: Progressing Pain being controlled by Roxicodone PRN, pt rates scale at >6 when it's needed for her pain.

## 2015-04-25 NOTE — Progress Notes (Signed)
Patient ID: Kathryn Norris, female   DOB: 08/10/1981, 34 y.o.   MRN: [id number]  [facility] Physicians PROGRESS NOTE  HPI/Subjective: Patient having pain in the left lower leg. She thinks that the redness is a little bit less. Still pain with some walking. No other complaints.  Objective: Filed Vitals:   04/25/15 0739  BP: 109/54  Pulse: 74  Temp: 98.3 F (36.8 C)  Resp: 17    Intake/Output Summary (Last 24 hours) at 04/25/15 1014 Last data filed at 04/25/15 0900  Gross per 24 hour  Intake    755 ml  Output    500 ml  Net    255 ml   Filed Weights   04/23/15 2303 04/24/15 0628 04/25/15 0500  Weight: 143.337 kg (316 lb) 144.017 kg (317 lb 8 oz) 143.065 kg (315 lb 6.4 oz)    ROS: Review of Systems  Constitutional: Negative for fever and chills.  Eyes: Negative for blurred vision.  Respiratory: Negative for cough and shortness of breath.   Cardiovascular: Negative for chest pain.  Gastrointestinal: Negative for nausea, vomiting, abdominal pain, diarrhea and constipation.  Genitourinary: Negative for dysuria.  Musculoskeletal: Positive for myalgias. Negative for joint pain.  Neurological: Negative for dizziness and headaches.   Exam: Physical Exam  HENT:  Nose: No mucosal edema.  Mouth/Throat: No oropharyngeal exudate or posterior oropharyngeal edema.  Eyes: Conjunctivae, EOM and lids are normal. Pupils are equal, round, and reactive to light.  Neck: No JVD present. Carotid bruit is not present. No edema present. No thyroid mass and no thyromegaly present.  Cardiovascular: S1 normal and S2 normal.  Exam reveals no gallop.   No murmur heard. Pulses:      Dorsalis pedis pulses are 2+ on the right side, and 2+ on the left side.  Respiratory: No respiratory distress. She has no wheezes. She has no rhonchi. She has no rales.  GI: Soft. Bowel sounds are normal. There is no tenderness.  Musculoskeletal:       Right ankle: She exhibits swelling.       Left ankle: She  exhibits swelling.  Lymphadenopathy:    She has no cervical adenopathy.  Neurological: She is alert. No cranial nerve deficit.  Skin: Skin is warm. Rash noted. Nails show no clubbing.  Erythema left calf extending up into the left thigh area medially. Looks like a potential bug bite on left leg area. Some fullness and some tenderness over the veins.  Psychiatric: She has a normal mood and affect.   Data Reviewed: Basic Metabolic Panel:  Recent Labs Lab 04/22/15 1234 04/24/15 0352 04/25/15 0538  NA 136 139 137  K 3.8 3.4* 3.8  CL 105 105 104  CO2 GLUCOSE 98 85 95  BUN CREATININE 0.64 0.76 0.66  CALCIUM 8.4* 8.8* 8.2*   Liver Function Tests:  Recent Labs Lab 04/24/15 0352  AST 25  ALT 24  ALKPHOS 77  BILITOT 0.6  PROT 7.6  ALBUMIN 3.7   CBC:  Recent Labs Lab 04/22/15 1234 04/24/15 0352 04/25/15 0538  WBC 9.0 11.5* 8.3  NEUTROABS 6.1  --   --   HGB 14.6 14.1 12.8  HCT 45.5 41.9 40.2  MCV 85.9 84.9 85.2  PLT 190 198 188    Recent Results (from the past 240 hour(s))  Culture, blood (routine x 2)     Status: None (Preliminary result)   Collection Time: 04/22/15 12:34 PM  Result Value Ref Range  Status   Specimen Description BLOOD  Final   Special Requests BLOOD  Final   Culture NO GROWTH 3 DAYS  Final   Report Status PENDING  Incomplete  Culture, blood (routine x 2)     Status: None (Preliminary result)   Collection Time: 04/22/15 12:34 PM  Result Value Ref Range Status   Specimen Description BLOOD  Final   Special Requests NONE  Final   Culture NO GROWTH 3 DAYS  Final   Report Status PENDING  Incomplete     Studies: No results found.  Scheduled Meds: .  ceFAZolin (ANCEF) IV  2 g Intravenous 3 times per day  . enoxaparin (LOVENOX) injection  40 mg Subcutaneous Q12H  . potassium chloride  20 mEq Oral Once  . sodium chloride  3 mL Intravenous Q12H   Continuous Infusions:   Assessment/Plan:  1. Cellulitis left lower extremity  with leukocytosis. Failed outpatient treatment with clindamycin. Continue IV Ancef. Some fullness over the veins - ultrasound showed no DVT. When necessary pain medication. I advised her to elevate her leg while she is in the bed. Patient will benefit from another day or so of IV antibiotics. White blood cell count normalized. 2. Hypokalemia- replaced. 3. Tobacco abuse 4. Obesity- weight loss needed.  Code Status:     Code Status Orders        Start     Ordered   04/24/15 0631  Full code   Continuous     04/24/15 0630     Disposition Plan: Home once cellulitis improves.  Time spent: 20 minutes.  Alford Highland  [facility]

## 2015-04-25 NOTE — Plan of Care (Signed)
Problem: Discharge Progression Outcomes Goal: Other Discharge Outcomes/Goals Outcome: Progressing Pt alert and oriented, complains of LLE pain and not  "getting better" Goals: Pt walk to bathroom as tolerated to prevent blood clots Possible vascular consult, nodules under cellulitis, concerning to pt Monitor vs and any noted fevers report to Dr.

## 2015-04-26 DIAGNOSIS — I809 Phlebitis and thrombophlebitis of unspecified site: Secondary | ICD-10-CM

## 2015-04-26 DIAGNOSIS — E669 Obesity, unspecified: Secondary | ICD-10-CM

## 2015-04-26 DIAGNOSIS — Z720 Tobacco use: Secondary | ICD-10-CM

## 2015-04-26 LAB — SEDIMENTATION RATE: Sed Rate: 44 mm/hr — ABNORMAL HIGH (ref 0–20)

## 2015-04-26 MED ORDER — METHYLPREDNISOLONE SODIUM SUCC 125 MG IJ SOLR
60.0000 mg | Freq: Four times a day (QID) | INTRAMUSCULAR | Status: DC
  Administered 2015-04-26 – 2015-04-27 (×4): 60 mg via INTRAVENOUS
  Filled 2015-04-26 (×4): qty 2

## 2015-04-26 MED ORDER — VANCOMYCIN HCL IN DEXTROSE 1-5 GM/200ML-% IV SOLN
1000.0000 mg | Freq: Three times a day (TID) | INTRAVENOUS | Status: DC
  Administered 2015-04-26 – 2015-04-27 (×2): 1000 mg via INTRAVENOUS
  Filled 2015-04-26 (×6): qty 200

## 2015-04-26 MED ORDER — VANCOMYCIN HCL 10 G IV SOLR
1500.0000 mg | INTRAVENOUS | Status: AC
  Administered 2015-04-26: 1500 mg via INTRAVENOUS
  Filled 2015-04-26: qty 1500

## 2015-04-26 MED ORDER — METHYLPREDNISOLONE SODIUM SUCC 125 MG IJ SOLR
80.0000 mg | Freq: Once | INTRAMUSCULAR | Status: AC
  Administered 2015-04-26: 80 mg via INTRAVENOUS
  Filled 2015-04-26: qty 2

## 2015-04-26 NOTE — Consult Note (Signed)
[facility] Vascular Consult Note  MRN : [id number]  Kathryn Norris is a 34 y.o. ([birth year]) female who presents with chief complaint of  Chief Complaint  Patient presents with  . Cellulitis  .  History of Present Illness: The patient is a 34 y.o. female with no significant past medical history presented to the emergency room several days ago with the complaints of increased redness and swelling and pain of left lower extremity. Patient states she developed redness, swelling and pain of left lower beginning a week ago which gradually worsened hence she came to the emergency room yesterday for evaluation. A DVT studies done which were negative for DVT and was diagnosed to have left lower extremity cellulitis, was given 1 dose of IV clindamycin and sent home on oral clindamycin and ibuprofen and was advised to monitor closely and return to ER if she notices increased swelling. Patient noticed increased swelling and redness with increased pain of left lower extremity hence return to the emergency room today for reevaluation. No history of fever or chills, no chest pain, shortness of breath, nausea, vomiting, diarrhea, dysuria.  Workup revealed elevated white blood cell count of 11.5 and potassium 3.4. Patient was started on IV vancomycin.  She denies prior DVT or thrombophlebitis.  No history of trauma.  She is not currently taking hormones.  She is a smoker.  Current Facility-Administered Medications  Medication Dose Route Frequency Provider Last Rate Last Dose  . 0.9 %  sodium chloride infusion  250 mL Intravenous PRN Crissie Figures, MD      . acetaminophen (TYLENOL) tablet 650 mg  650 mg Oral Q6H PRN Crissie Figures, MD       Or  . acetaminophen (TYLENOL) suppository 650 mg  650 mg Rectal Q6H PRN Crissie Figures, MD      . enoxaparin (LOVENOX) injection 40 mg  40 mg Subcutaneous Q12H Crissie Figures, MD   40 mg at 04/26/15 2110  . ibuprofen (ADVIL,MOTRIN) tablet 800 mg   800 mg Oral Q8H PRN Crissie Figures, MD   800 mg at 04/24/15 4098  . methylPREDNISolone sodium succinate (SOLU-MEDROL) 125 mg/2 mL injection 60 mg  60 mg Intravenous Q6H Katharina Caper, MD   60 mg at 04/26/15 2110  . ondansetron (ZOFRAN) tablet 4 mg  4 mg Oral Q6H PRN Crissie Figures, MD       Or  . ondansetron [facility]) injection 4 mg  4 mg Intravenous Q6H PRN Crissie Figures, MD      . oxyCODONE (Oxy IR/ROXICODONE) immediate release tablet 5 mg  5 mg Oral Q4H PRN Alford Highland, MD   5 mg at 04/26/15 1852  . potassium chloride SA (K-DUR,KLOR-CON) CR tablet 20 mEq  20 mEq Oral Once Crissie Figures, MD      . sodium chloride 0.9 % injection 3 mL  3 mL Intravenous Q12H Crissie Figures, MD   3 mL at 04/26/15 2111  . sodium chloride 0.9 % injection 3 mL  3 mL Intravenous PRN Crissie Figures, MD   3 mL at 04/25/15 2148  . vancomycin (VANCOCIN) IVPB 1000 mg/200 mL premix  1,000 mg Intravenous Q8H Katharina Caper, MD   1,000 mg at 04/26/15 2110    Past Medical History  Diagnosis Date  . Cellulitis   . Medical history non-contributory     Past Surgical History  Procedure Laterality Date  . Cholecystectomy      Social History History  Substance Use Topics  . Smoking status: Smoker, Current Status Unknown  . Smokeless tobacco: Not on file  . Alcohol Use: Yes    Family History Family History  Problem Relation Age of Onset  . Diabetes Mother   No history of porphyria or clotting disorders  Allergies  Allergen Reactions  . Shellfish Allergy Itching     REVIEW OF SYSTEMS (Negative unless checked)  Constitutional: Weight loss  Fever  Chills Cardiac: Chest pain   Chest pressure   Palpitations   Shortness of breath when laying flat   Shortness of breath at rest   Shortness of breath with exertion. Vascular:  Pain in legs with walking   Pain in legs at rest   Pain in legs when laying flat   Claudication   Pain in feet when walking  Pain in feet at rest   Pain in feet when laying flat   History of DVT   Phlebitis   Swelling in legs   Varicose veins   Non-healing ulcers Pulmonary:   Uses home oxygen   Productive cough   Hemoptysis   Wheeze  COPD   Asthma Neurologic:  Dizziness  Blackouts   Seizures   History of stroke   History of TIA  Aphasia   Temporary blindness   Dysphagia   Weakness or numbness in arms   Weakness or numbness in legs Musculoskeletal:  Arthritis   Joint swelling   Joint pain   Low back pain Hematologic:  Easy bruising  Easy bleeding   Hypercoagulable state   Anemic  Hepatitis Gastrointestinal:  Blood in stool   Vomiting blood  Gastroesophageal reflux/heartburn   Difficulty swallowing. Genitourinary:  Chronic kidney disease   Difficult urination  Frequent urination  Burning with urination   Blood in urine Skin:  Rashes   Ulcers   Wounds Psychological:  History of anxiety    History of major depression.   Physical Examination  Filed Vitals:   04/26/15 0500 04/26/15 0749 04/26/15 0751 04/26/15 1633  BP:  88/53 104/39 119/50  Pulse:  62 53 67  Temp:  98.2 F (36.8 C)    TempSrc:  Oral    Resp:  17  17  Height:      Weight: 312 lb 11.2 oz (141.84 kg)     SpO2:  99%  100%   Body mass index is 52.04 kg/(m^2).  Head: [place]/AT, No temporalis wasting. Prominent temp pulse not noted. Ear/Nose/Throat: Nares w/o erythema or drainage, oropharynx w/o obsrtuction,  Dentition good.  Eyes: PERRLA, Sclera nonicteric.  Neck: Supple, no nuchal rigidity.  No bruit or JVD.  Pulmonary:  Breath sounds equal bilaterally, no use of accessory muscles.  Cardiac: RRR, normal S1, S2, no Murmurs, rubs or gallops. Vascular: extensive varicosities bilaterally with large area of STP on the left thigh Gastrointestinal: soft, non-tender, non-distended.  Musculoskeletal: Moves all extremities.  No deformity or atrophy. No edema. Neurologic: CN 2-12 intact.  Symmetrical.  Speech is fluent.  Psychiatric: Judgment intact, Mood & affect appropriate for pt's clinical situation. Dermatologic: No rashes or ulcers noted.  No cellulitis or open wounds. Lymph : No Cervical,  or Inguinal lymphadenopathy.      CBC Lab Results  Component Value Date   WBC 8.3 04/25/2015   HGB 12.8 04/25/2015   HCT 40.2 04/25/2015   MCV 85.2 04/25/2015   PLT 188 04/25/2015    BMET    Component Value Date/Time   NA 137 04/25/2015 0538   K 3.8 04/25/2015  0538   CL 104 04/25/2015 0538   CO2 27 04/25/2015 0538   GLUCOSE 95 04/25/2015 0538   BUN 12 04/25/2015 0538   CREATININE 0.66 04/25/2015 0538   CALCIUM 8.2* 04/25/2015 0538   GFRNONAA >60 04/25/2015 0538   GFRAA >60 04/25/2015 0538   Estimated Creatinine Clearance: 140.8 mL/min (by C-G formula based on Cr of 0.66).  COAG No results found for: INR, PROTIME  Radiology Duplex US of the left leg venous is negative for DVT no mention of STP  Assessment/Plan 1. Left lower extremity STP:  Continue elevation of left lower extremity, pain control meds, hot or cold compresses as tolerated.  I will repeat the duplex to see if the STP in the great saphenous vein is within 6 cm of the sapheno-femoral  Junction.  If it is then she will benefit from 6-8 weeks of anticoagulation 2. Hypokalemia, mild. Replace potassium, follow-up BMP. 3. Tobacco usage, continuous. Counseled to quit.  This was stressed  Patient not motivated to quit at this time 4.  Varicose veins  Further work up as an out patient she will definitely benefit from compression and perhaps from laser ablation pending review of a reflux study. 5.  Morbid Obesity  Nutritional counseling    Radie Berges, Latina Craver, MD  04/26/2015 9:19 PM

## 2015-04-26 NOTE — Progress Notes (Signed)
ANTIBIOTIC CONSULT NOTE - INITIAL  Pharmacy Consult for Vancomycin Indication: superficial thrombophlebitis   Allergies  Allergen Reactions  . Shellfish Allergy Itching    Patient Measurements: Height: 5\' 5"  (165.1 cm) Weight: (!) 312 lb 11.2 oz (141.84 kg) IBW/kg (Calculated) : 57 Adjusted Body Weight: 90.2 kg   Vital Signs: Temp: 98.2 F (36.8 C) (06/15 0749) Temp Source: Oral (06/15 0749) BP: 104/39 mmHg (06/15 0751) Pulse Rate: 53 (06/15 0751) Intake/Output from previous day: 06/14 0701 - 06/15 0700 In: 1160 [P.O.:960; IV Piggyback:200] Out: 1550 [Urine:1550] Intake/Output from this shift: Total I/O In: 0  Out: 500 [Urine:500]  Labs:  Recent Labs  04/24/15 0352 04/25/15 0538  WBC 11.5* 8.3  HGB 14.1 12.8  PLT 198 188  CREATININE 0.76 0.66   Estimated Creatinine Clearance: 140.8 mL/min (by C-G formula based on Cr of 0.66). No results for input(s): VANCOTROUGH, VANCOPEAK, VANCORANDOM, GENTTROUGH, GENTPEAK, GENTRANDOM, TOBRATROUGH, TOBRAPEAK, TOBRARND, AMIKACINPEAK, AMIKACINTROU, AMIKACIN in the last 72 hours.   Microbiology: Recent Results (from the past 720 hour(s))  Culture, blood (routine x 2)     Status: None (Preliminary result)   Collection Time: 04/22/15 12:34 PM  Result Value Ref Range Status   Specimen Description BLOOD  Final   Special Requests BLOOD  Final   Culture NO GROWTH 4 DAYS  Final   Report Status PENDING  Incomplete  Culture, blood (routine x 2)     Status: None (Preliminary result)   Collection Time: 04/22/15 12:34 PM  Result Value Ref Range Status   Specimen Description BLOOD  Final   Special Requests NONE  Final   Culture NO GROWTH 4 DAYS  Final   Report Status PENDING  Incomplete    Medical History: Past Medical History  Diagnosis Date  . Cellulitis   . Medical history non-contributory     Medications:  Scheduled:  . enoxaparin (LOVENOX) injection  40 mg Subcutaneous Q12H  . methylPREDNISolone (SOLU-MEDROL) injection   80 mg Intravenous Once  . potassium chloride  20 mEq Oral Once  . sodium chloride  3 mL Intravenous Q12H  . vancomycin  1,500 mg Intravenous STAT  . vancomycin  1,000 mg Intravenous Q8H   Assessment: Patient being treated superficial thrombophlebitis. Patient previously received Ancef therapy.   Ke= 0.0799; t1/2: ~8 hours; VD: 63.14L  Goal of Therapy:  Vancomycin trough level 15-20 mcg/ml  Plan:   Will give Vancomycin 1500 mg IV x 1 then will start Vancomycin 1 g IV q8 hours @ 21:00 on 6/15.  Will order Vancomycin trough level prior to the 21:00 dose on 6/15.  Follow up culture results  Anaiah Mcmannis D 04/26/2015,1:30 PM

## 2015-04-26 NOTE — Progress Notes (Signed)
Patient remains stable but complaining of left leg pain due to cellulitis,pain management in progress,iv antibiotics in progress,vital signs within normal limits.

## 2015-04-26 NOTE — Progress Notes (Signed)
I let nurse know

## 2015-04-26 NOTE — Progress Notes (Signed)
Patient ID: Kathryn Norris, female   DOB: 05/07/1981, 33 y.o.   MRN: [id number] [facility] PROGRESS NOTE  HPI/Subjective: Patient with left leg pain. Redness in the side of anterior vein started about 5 days prior to arrival . Was put on oral clindamycin and sent home and came back with worsening leg pain and redness. Not sure if it started off as a bug bite or not. Still has significant pain when touched in the area of nodularity in anterior middle calf / tibial area, no increased warmth noted. No significant improvement with antibiotics therapy by now  Objective: Filed Vitals:   04/26/15 0751  BP: 104/39  Pulse: 53  Temp:   Resp:     Intake/Output Summary (Last 24 hours) at 04/26/15 1125 Last data filed at 04/26/15 0800  Gross per 24 hour  Intake    680 ml  Output   1700 ml  Net  -1020 ml   Filed Weights   04/24/15 0628 04/25/15 0500 04/26/15 0500  Weight: 144.017 kg (317 lb 8 oz) 143.065 kg (315 lb 6.4 oz) 141.84 kg (312 lb 11.2 oz)    ROS: Review of Systems  Constitutional: Negative for fever and chills.  Eyes: Negative for blurred vision.  Respiratory: Negative for cough and shortness of breath.   Cardiovascular: Negative for chest pain.  Gastrointestinal: Negative for nausea, vomiting, abdominal pain, diarrhea and constipation.  Genitourinary: Negative for dysuria.  Musculoskeletal: Negative for myalgias and joint pain.  Neurological: Negative for dizziness and headaches.   Exam: Physical Exam  HENT:  Nose: No mucosal edema.  Mouth/Throat: No oropharyngeal exudate or posterior oropharyngeal edema.  Eyes: Conjunctivae, EOM and lids are normal. Pupils are equal, round, and reactive to light.  Neck: No JVD present. Carotid bruit is not present. No edema present. No thyroid mass and no thyromegaly present.  Cardiovascular: S1 normal and S2 normal.  Exam reveals no gallop.   No murmur heard. Pulses:      Dorsalis pedis pulses are 2+ on the right side, and  2+ on the left side.  Respiratory: No respiratory distress. She has no wheezes. She has no rhonchi. She has no rales.  GI: Soft. Bowel sounds are normal. There is no tenderness.  Musculoskeletal:       Right ankle: She exhibits swelling.       Left ankle: She exhibits swelling.  Lymphadenopathy:    She has no cervical adenopathy.  Neurological: She is alert. No cranial nerve deficit.  Skin: Skin is warm. Rash noted. Nails show no clubbing.  Erythema left calf extending up into the left thigh area medially. Looks like a potential bug bite on left leg area. Some fullness and some tenderness over the veins.  Psychiatric: She has a normal mood and affect.   Data Reviewed: Basic Metabolic Panel:  Recent Labs Lab 04/22/15 1234 04/24/15 0352 04/25/15 0538  NA 136 139 137  K 3.8 3.4* 3.8  CL 105 105 104  CO2 GLUCOSE 98 85 95  BUN CREATININE 0.64 0.76 0.66  CALCIUM 8.4* 8.8* 8.2*   Liver Function Tests:  Recent Labs Lab 04/24/15 0352  AST 25  ALT 24  ALKPHOS 77  BILITOT 0.6  PROT 7.6  ALBUMIN 3.7   CBC:  Recent Labs Lab 04/22/15 1234 04/24/15 0352 04/25/15 0538  WBC 9.0 11.5* 8.3  NEUTROABS 6.1  --   --   HGB 14.6 14.1 12.8  HCT 45.5 41.9  40.2  MCV 85.9 84.9 85.2  PLT 190 198 188    Recent Results (from the past 240 hour(s))  Culture, blood (routine x 2)     Status: None (Preliminary result)   Collection Time: 04/22/15 12:34 PM  Result Value Ref Range Status   Specimen Description BLOOD  Final   Special Requests BLOOD  Final   Culture NO GROWTH 4 DAYS  Final   Report Status PENDING  Incomplete  Culture, blood (routine x 2)     Status: None (Preliminary result)   Collection Time: 04/22/15 12:34 PM  Result Value Ref Range Status   Specimen Description BLOOD  Final   Special Requests NONE  Final   Culture NO GROWTH 4 DAYS  Final   Report Status PENDING  Incomplete     Studies: Dg Chest Port 1 View  04/25/2015   CLINICAL DATA:   Central catheter placement  EXAM: PORTABLE CHEST - 1 VIEW  COMPARISON:  None.  FINDINGS: Central catheter tip is at the cavoatrial junction. No pneumothorax. The lungs are clear. Heart size and pulmonary vascularity are normal. No adenopathy. No bone lesions.  IMPRESSION: Central catheter tip at cavoatrial junction. No pneumothorax. Lungs clear.   Electronically Signed   By: Bretta Bang III M.D.   On: 04/25/2015 20:29    Scheduled Meds: .  ceFAZolin (ANCEF) IV  2 g Intravenous 3 times per day  . enoxaparin (LOVENOX) injection  40 mg Subcutaneous Q12H  . methylPREDNISolone (SOLU-MEDROL) injection  80 mg Intravenous Once  . potassium chloride  20 mEq Oral Once  . sodium chloride  3 mL Intravenous Q12H   Continuous Infusions:   Assessment/Plan:  1. Superficial left lower extremity thrombophlebitis vs erythema nodosum with leukocytosis. Failed outpatient treatment with clindamycin. Continue for now IV Ancef and off vancomycin. Get an erythrocyte sedimentation rate, if elevated, may need to have prolonged prednisone therapy, give one dose of Solu-Medrol, and if improvement  potential discharge today. Some fullness over the superficial vein, but Doppler ultrasound showed no DVT. Continue as needed pain medication.  2. Hypokalemia- replace this morning. 3. Tobacco abuse, nicotine replacement therapy 4. Obesity- weight loss needed.  Code Status:     Code Status Orders        Start     Ordered   04/24/15 0631  Full code   Continuous     04/24/15 0630     Disposition Plan: Home , likely today if erythema, nodularity, improved with steroids  Time spent: 40 minutes.  [facility]  [facility]

## 2015-04-27 ENCOUNTER — Inpatient Hospital Stay: Payer: Medicaid Other

## 2015-04-27 LAB — CULTURE, BLOOD (ROUTINE X 2)
CULTURE: NO GROWTH
CULTURE: NO GROWTH

## 2015-04-27 MED ORDER — ASPIRIN EC 81 MG PO TBEC: 81.0000 mg | DELAYED_RELEASE_TABLET | Freq: Every day | ORAL | Status: AC

## 2015-04-27 MED ORDER — OXYCODONE HCL 5 MG PO TABS: 5.0000 mg | ORAL_TABLET | ORAL | Status: AC | PRN

## 2015-04-27 NOTE — Progress Notes (Signed)
Patient ID: Kathryn Norris, female   DOB: 17-Sep-1981, 34 y.o.   MRN: [id number] [facility] PROGRESS NOTE  HPI/Subjective: Patient with left leg pain. Redness in the side of anterior vein started about 5 days prior to arrival . Was put on oral clindamycin and sent home and came back with worsening leg pain and redness. A shin is diagnosed with Superficial thrombophlebitis of great saphenous vein Still has significant pain when touched in the area of nodularity in anterior middle calf / tibial area, no increased warmth noted. No significant improvement with antibiotics therapy by now. Eskalith surgery recommended to repeat ultrasound to see if greater saphenous vein thrombophlebitis is close to Saphenofemoral junction, in that case, anticoagulation would be recommended  Objective: Filed Vitals:   04/27/15 0759  BP: 108/47  Pulse: 51  Temp: 98.2 F (36.8 C)  Resp: 17    Intake/Output Summary (Last 24 hours) at 04/27/15 1218 Last data filed at 04/27/15 0908  Gross per 24 hour  Intake    740 ml  Output   3500 ml  Net  -2760 ml   Filed Weights   04/25/15 0500 04/26/15 0500 04/27/15 0427  Weight: 143.065 kg (315 lb 6.4 oz) 141.84 kg (312 lb 11.2 oz) 144.244 kg (318 lb)    ROS: Review of Systems  Constitutional: Negative for fever and chills.  Eyes: Negative for blurred vision.  Respiratory: Negative for cough and shortness of breath.   Cardiovascular: Negative for chest pain.  Gastrointestinal: Negative for nausea, vomiting, abdominal pain, diarrhea and constipation.  Genitourinary: Negative for dysuria.  Musculoskeletal: Negative for myalgias and joint pain.  Neurological: Negative for dizziness and headaches.   Exam: Physical Exam  HENT:  Nose: No mucosal edema.  Mouth/Throat: No oropharyngeal exudate or posterior oropharyngeal edema.  Eyes: Conjunctivae, EOM and lids are normal. Pupils are equal, round, and reactive to light.  Neck: No JVD present. Carotid bruit  is not present. No edema present. No thyroid mass and no thyromegaly present.  Cardiovascular: S1 normal and S2 normal.  Exam reveals no gallop.   No murmur heard. Pulses:      Dorsalis pedis pulses are 2+ on the right side, and 2+ on the left side.  Respiratory: No respiratory distress. She has no wheezes. She has no rhonchi. She has no rales.  GI: Soft. Bowel sounds are normal. There is no tenderness.  Musculoskeletal:       Right ankle: She exhibits swelling.       Left ankle: She exhibits swelling.  Lymphadenopathy:    She has no cervical adenopathy.  Neurological: She is alert. No cranial nerve deficit.  Skin: Skin is warm. Rash noted. Nails show no clubbing.  Erythema left calf extending up into the left thigh area medially. Looks like a potential bug bite on left leg area. Some fullness and some tenderness over the veins.  Psychiatric: She has a normal mood and affect.   Data Reviewed: Basic Metabolic Panel:  Recent Labs Lab 04/22/15 1234 04/24/15 0352 04/25/15 0538  NA 136 139 137  K 3.8 3.4* 3.8  CL 105 105 104  CO2 25 29 27   GLUCOSE 98 85 95  BUN 10 14 12   CREATININE 0.64 0.76 0.66  CALCIUM 8.4* 8.8* 8.2*   Liver Function Tests:  Recent Labs Lab 04/24/15 0352  AST 25  ALT 24  ALKPHOS 77  BILITOT 0.6  PROT 7.6  ALBUMIN 3.7   CBC:  Recent Labs Lab 04/22/15 1234 04/24/15 0352  04/25/15 0538  WBC 9.0 11.5* 8.3  NEUTROABS 6.1  --   --   HGB 14.6 14.1 12.8  HCT 45.5 41.9 40.2  MCV 85.9 84.9 85.2  PLT 190 198 188    Recent Results (from the past 240 hour(s))  Culture, blood (routine x 2)     Status: None   Collection Time: 04/22/15 12:34 PM  Result Value Ref Range Status   Specimen Description BLOOD  Final   Special Requests BLOOD  Final   Culture NO GROWTH 5 DAYS  Final   Report Status 04/27/2015 FINAL  Final  Culture, blood (routine x 2)     Status: None   Collection Time: 04/22/15 12:34 PM  Result Value Ref Range Status   Specimen  Description BLOOD  Final   Special Requests NONE  Final   Culture NO GROWTH 5 DAYS  Final   Report Status 04/27/2015 FINAL  Final     Studies: Dg Chest Port 1 View  04/25/2015   CLINICAL DATA:  Central catheter placement  EXAM: PORTABLE CHEST - 1 VIEW  COMPARISON:  None.  FINDINGS: Central catheter tip is at the cavoatrial junction. No pneumothorax. The lungs are clear. Heart size and pulmonary vascularity are normal. No adenopathy. No bone lesions.  IMPRESSION: Central catheter tip at cavoatrial junction. No pneumothorax. Lungs clear.   Electronically Signed   By: Bretta Bang III M.D.   On: 04/25/2015 20:29    Scheduled Meds: . enoxaparin (LOVENOX) injection  40 mg Subcutaneous Q12H  . methylPREDNISolone (SOLU-MEDROL) injection  60 mg Intravenous Q6H  . potassium chloride  20 mEq Oral Once  . sodium chloride  3 mL Intravenous Q12H  . vancomycin  1,000 mg Intravenous Q8H   Continuous Infusions:   Assessment/Plan:  1. Superficial left lower extremity greater saphenous vein thrombophlebitis . Resume  Clindamycin upon discharge. Continue for now IV ancomycin. erythrocyte sedimentation rate was minimally elevated, discontinue steroids. Initial Doppler ultrasound showed no DVT, repeat ultrasound to rule out thrombosis close the saphenofemoral junction . Initiate Eliquis if DVT is noted , continue as needed pain medication.  2. Hypokalemia- replace this morning. 3. Tobacco abuse, nicotine replacement therapy 4. Obesity- weight loss needed.  Code Status:     Code Status Orders        Start     Ordered   04/24/15 0631  Full code   Continuous     04/24/15 0630     Disposition Plan: Home , likely today if erythema, nodularity, improved with steroids  Time spent: 40 minutes.  [facility]  [facility] [place] [facility]

## 2015-04-27 NOTE — Discharge Summary (Signed)
[facility] - [place] at [facility]   PATIENT NAME: Kathryn Norris    MR#:  [id number]  DATE OF BIRTH:  05-Oct-1981  DATE OF ADMISSION:  04/24/2015 ADMITTING PHYSICIAN: Crissie Figures, MD  DATE OF DISCHARGE: No discharge date for patient encounter.  PRIMARY CARE PHYSICIAN: Pcp Not In System     ADMISSION DIAGNOSIS:  Cellulitis of left lower extremity [L03.116]  DISCHARGE DIAGNOSIS:  Principal Problem:   Superficial thrombophlebitis Active Problems:   Tobacco abuse   Obesity   Hypokalemia   SECONDARY DIAGNOSIS:   Past Medical History  Diagnosis Date  . Cellulitis   . Medical history non-contributory     .pro HOSPITAL COURSE:   Patient with left leg pain. Redness in the side of anterior vein started about 5 days prior to arrival . Was put on oral clindamycin and sent home and came back with worsening leg pain and redness. She was diagnosed with Superficial thrombophlebitis of great saphenous vein but not close to Sapheno-femoral junction, in this case, anticoagulation with Coumadin/Eliquis is not recommended Discussion by a problem: 1. Superficial left lower extremity greater saphenous vein thrombophlebitis . Resume Clindamycin upon discharge.  Doppler ultrasound showed no DVT, repeat ultrasound also thrombosis not close the saphenofemoral junction . Start on asa.  2. Hypokalemia- replace this morning. 3. Tobacco abuse, nicotine replacement therapy 4. Obesity- weight loss needed DISCHARGE CONDITIONS:   Full code  CONSULTS OBTAINED:  Treatment Team:  Katharina Caper, MD Tonette Lederer, PA-C  DRUG ALLERGIES:   Allergies  Allergen Reactions  . Shellfish Allergy Itching    DISCHARGE MEDICATIONS:   Current Discharge Medication List    START taking these medications   Details  aspirin EC 81 MG tablet Take 1 tablet (81 mg total) by mouth daily. Qty: 30 tablet, Refills: 0    oxyCODONE (OXY IR/ROXICODONE) 5 MG immediate release  tablet Take 1 tablet (5 mg total) by mouth every 4 (four) hours as needed for severe pain. Qty: 30 tablet, Refills: 0      CONTINUE these medications which have NOT CHANGED   Details  clindamycin (CLEOCIN) 300 MG capsule Take 1 capsule (300 mg total) by mouth 4 (four) times daily. Qty: 40 capsule, Refills: 0      STOP taking these medications     ibuprofen (ADVIL,MOTRIN) 800 MG tablet          DISCHARGE INSTRUCTIONS:    Follow up with PCP and vascular surgeon DR Gilda Crease in about 1 week.   If you experience worsening of your admission symptoms, develop shortness of breath, life threatening emergency, suicidal or homicidal thoughts you must seek medical attention immediately by calling 911 or calling your MD immediately  if symptoms less severe.  You Must read complete instructions/literature along with all the possible adverse reactions/side effects for all the Medicines you take and that have been prescribed to you. Take any new Medicines after you have completely understood and accept all the possible adverse reactions/side effects.   Please note  You were cared for by a hospitalist during your hospital stay. If you have any questions about your discharge medications or the care you received while you were in the hospital after you are discharged, you can call the unit and asked to speak with the hospitalist on call if the hospitalist that took care of you is not available. Once you are discharged, your primary care physician will handle any further medical issues. Please note that NO REFILLS for any  discharge medications will be authorized once you are discharged, as it is imperative that you return to your primary care physician (or establish a relationship with a primary care physician if you do not have one) for your aftercare needs so that they can reassess your need for medications and monitor your lab values.    Today   CHIEF COMPLAINT:   Chief Complaint  Patient  presents with  . Cellulitis    HISTORY OF PRESENT ILLNESS:  Kathryn Norris  is a 34 y.o. female with a known history of obesity, tobacco abuse came in with left leg pain, redness in the side of anterior vein started about 5 days prior to arrival . Was put on oral clindamycin and sent home and came back with worsening leg pain and redness. She was diagnosed with Superficial thrombophlebitis of great saphenous vein but not close to Sapheno-femoral junction, in this case, anticoagulation with Coumadin/Eliquis is not recommended Discussion by a problem: 1.Superficial left lower extremity greater saphenous vein thrombophlebitis . Resume Clindamycin upon discharge.  Doppler ultrasound showed no DVT, repeat ultrasound also thrombosis not close the saphenofemoral junction . Start on asa. 2.Hypokalemia- replace this morning. 3.Tobacco abuse, nicotine replacement therapy 4. Obesity- weight loss needed   VITAL SIGNS:  Blood pressure 108/47, pulse 51, temperature 98.2 F (36.8 C), temperature source Oral, resp. rate 17, height  (1.651 m), weight 144.244 kg (318 lb), last menstrual period 03/22/2015, SpO2 94 %.  I/O:   Intake/Output Summary (Last 24 hours) at 04/27/15 1414 Last data filed at 04/27/15 1240  Gross per 24 hour  Intake    740 ml  Output   3250 ml  Net  -2510 ml    PHYSICAL EXAMINATION:  GENERAL:  34 y.o.-year-old patient lying in the bed with no acute distress. Obese.  EYES: Pupils equal, round, reactive to light and accommodation. No scleral icterus. Extraocular muscles intact.  HEENT: Head atraumatic, normocephalic. Oropharynx and nasopharynx clear.  NECK:  Supple, no jugular venous distention. No thyroid enlargement, no tenderness.  LUNGS: Normal breath sounds bilaterally, no wheezing, rales,rhonchi or crepitation. No use of accessory muscles of respiration.  CARDIOVASCULAR: S1, S2 normal. No murmurs, rubs, or gallops.  ABDOMEN: Soft, non-tender, non-distended. Bowel sounds  present. No organomegaly or mass.  EXTREMITIES: No pedal edema, cyanosis, or clubbing.  NEUROLOGIC: Cranial nerves II through XII are intact. Muscle strength 5/5 in all extremities. Sensation intact. Gait not checked.  PSYCHIATRIC: The patient is alert and oriented x 3.  SKIN: Erythema in anterior medial calf area tracking of superficial  Vein, no lesion, or ulcer. Erythema over medial aspect of left lower extremity extending from mid calf to mid thigh, warm to touch and tender on palpation. Nodularity and tenderness with palpation along the rout of vein.   DATA REVIEW:   CBC  Recent Labs Lab 04/25/15 0538  WBC 8.3  HGB 12.8  HCT 40.2  PLT 188    Chemistries   Recent Labs Lab 04/24/15 0352 04/25/15 0538  NA 139 137  K 3.4* 3.8  CL 105 104  CO2 29 27  GLUCOSE 85 95  BUN 14 12  CREATININE 0.76 0.66  CALCIUM 8.8* 8.2*  AST 25  --   ALT 24  --   ALKPHOS 77  --   BILITOT 0.6  --     Cardiac Enzymes No results for input(s): TROPONINI in the last 168 hours.  Microbiology Results  Results for orders placed or performed during the hospital encounter of  04/22/15  Culture, blood (routine x 2)     Status: None   Collection Time: 04/22/15 12:34 PM  Result Value Ref Range Status   Specimen Description BLOOD  Final   Special Requests BLOOD  Final   Culture NO GROWTH 5 DAYS  Final   Report Status 04/27/2015 FINAL  Final  Culture, blood (routine x 2)     Status: None   Collection Time: 04/22/15 12:34 PM  Result Value Ref Range Status   Specimen Description BLOOD  Final   Special Requests NONE  Final   Culture NO GROWTH 5 DAYS  Final   Report Status 04/27/2015 FINAL  Final    RADIOLOGY:  US Venous Img Lower Unilateral Left  04/27/2015   CLINICAL DATA:  Thrombophlebitis  EXAM: LEFT LOWER EXTREMITY VENOUS DUPLEX ULTRASOUND  TECHNIQUE: Doppler venous assessment of the left lower extremity deep venous system was performed, including characterization of spectral flow,  compressibility, and phasicity.  COMPARISON:  None.  FINDINGS: There is complete compressibility of the left common femoral, femoral, and popliteal veins. Doppler analysis demonstrates respiratory phasicity and augmentation of flow upon calf compression. No obvious DVT in the calf. The greater saphenous vein is thrombosed and noncompressible from the midthigh to the lower calf.  IMPRESSION: No evidence of left lower extremity DVT.  Thrombosis of the greater saphenous vein.   Electronically Signed   By: Jolaine Click M.D.   On: 04/27/2015 13:40   Dg Chest Port 1 View  04/25/2015   CLINICAL DATA:  Central catheter placement  EXAM: PORTABLE CHEST - 1 VIEW  COMPARISON:  None.  FINDINGS: Central catheter tip is at the cavoatrial junction. No pneumothorax. The lungs are clear. Heart size and pulmonary vascularity are normal. No adenopathy. No bone lesions.  IMPRESSION: Central catheter tip at cavoatrial junction. No pneumothorax. Lungs clear.   Electronically Signed   By: Bretta Bang III M.D.   On: 04/25/2015 20:29    EKG:  No orders found for this or any previous visit.    Management plans discussed with the patient, family and they are in agreement.  CODE STATUS:     Code Status Orders        Start     Ordered   04/24/15 0631  Full code   Continuous     04/24/15 0630      TOTAL TIME TAKING CARE OF THIS PATIENT: 40 minutes.    Katharina Caper M.D on 04/27/2015 at 2:14 PM  Between 7am to 6pm - Pager - [phone]  After 6pm go to [email or web address] - password [email or web address]  [facility]  Office  [phone]  CC: Primary care physician; Pcp Not In System

## 2015-04-27 NOTE — Care Management (Signed)
Discussed during progression- no d/c needs.

## 2015-04-27 NOTE — Progress Notes (Signed)
Pt A&O. VSS. Prescription given. Concerns addressed. Discharge summary given. PICC line removed. Reminded to continue oral antibiotics and take aspirin.

## 2015-04-27 NOTE — Discharge Planning (Signed)
The patient is given a work excuse note for  [facility] [address] [place] [id number] [place] [address] Phone: [phone] Fax: [phone]  April 27, 2015  Patient: Kathryn Norris  Date of Birth: [birth year]  Date of Visit: 04/23/2015    To Whom It May Concern:  Sifan Shonk was seen and treated in [facility] 04/23/2015 through 04/27/2015. [name]  may return to work on 05/04/2015.  Sincerely,   Katharina Caper, MD    days.

## 2015-04-27 NOTE — Progress Notes (Signed)
Initial Nutrition Assessment  DOCUMENTATION CODES:     INTERVENTION:   (Coordination of Nutrition Care:) Recommend outpatient referral for weight management  NUTRITION DIAGNOSIS:   (None at this time) related to   as evidenced by  .    GOAL:  Patient will meet greater than or equal to 90% of their needs    MONITOR:   (Energy intake)  REASON FOR ASSESSMENT:  Consult Assessment of nutrition requirement/status  ASSESSMENT:  Pt admitted with cellulitis of LLE  Past Medical History  Diagnosis Date  . Cellulitis   . Medical history non-contributory     Current Nutrition: eating well per pt and tolerating meals   Food/Nutrition-Related History: Good intake prior to admission   Labs:  Electrolyte and Renal Profile:  Recent Labs Lab 04/22/15 1234 04/24/15 0352 04/25/15 0538  BUN 10 14 12   CREATININE 0.64 0.76 0.66  NA 136 139 137  K 3.8 3.4* 3.8      Medications: solumedrol    Weight Change: stable weight per pt    Height:  Ht Readings from Last 1 Encounters:  04/24/15 5\' 5"  (1.651 m)    Weight:  Wt Readings from Last 1 Encounters:  04/27/15 318 lb (144.244 kg)        Wt Readings from Last 10 Encounters:  04/27/15 318 lb (144.244 kg)  04/22/15 316 lb (143.337 kg)    BMI:  Body mass index is 52.92 kg/(m^2).   Diet Order:  Diet regular Room service appropriate?: Yes; Fluid consistency:: Thin Diet - low sodium heart healthy  EDUCATION NEEDS:  Education needs no appropriate at this time   Intake/Output Summary (Last 24 hours) at 04/27/15 1348 Last data filed at 04/27/15 1240  Gross per 24 hour  Intake    740 ml  Output   3250 ml  Net  -2510 ml    LOW Care Level Khaleef Ruby B. Freida Busman, RD, LDN [phone] (pager)

## 2017-02-16 IMAGING — US US EXTREM LOW VENOUS*L*
1 series · 13 of 24 positions shown · non-contrast
Comparison: None.

CLINICAL DATA: Left leg swelling for 1 week. Associated pain and
discoloration. No history of DVT. Initial encounter.



[Series 1: us extrem low venous*left* · 0.09mm/px · 13 of 30 slices shown]
[im 1/30]
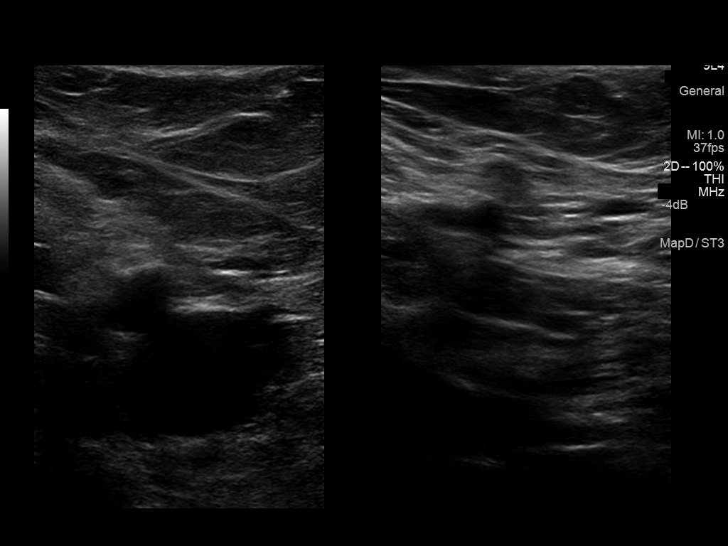
[im 3/30]
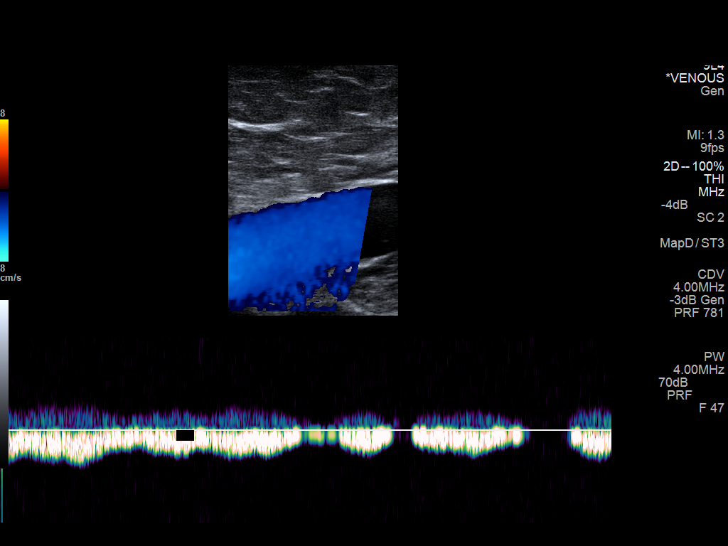
[im 6/30]
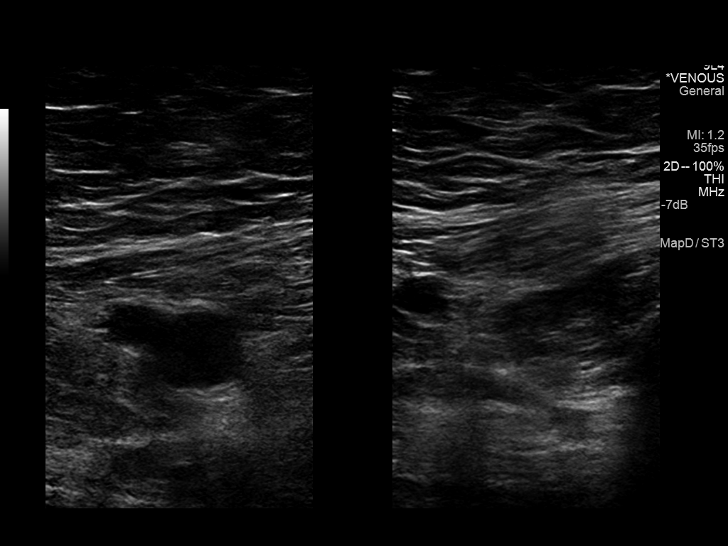
[im 8/30]
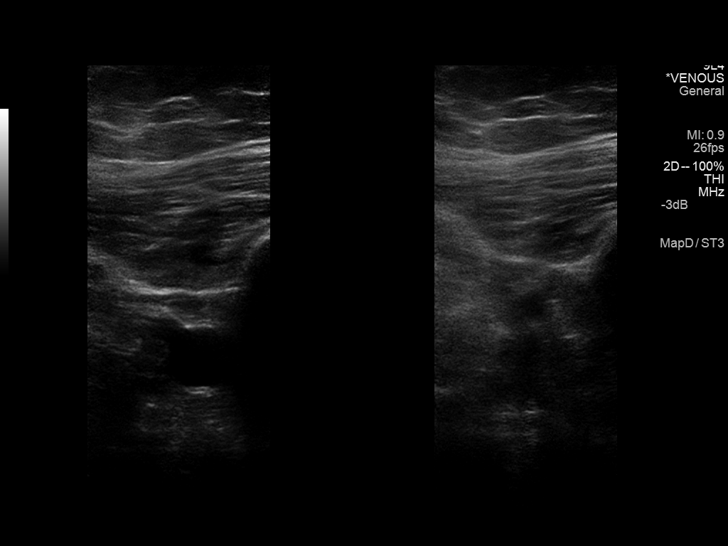
[im 11/30]
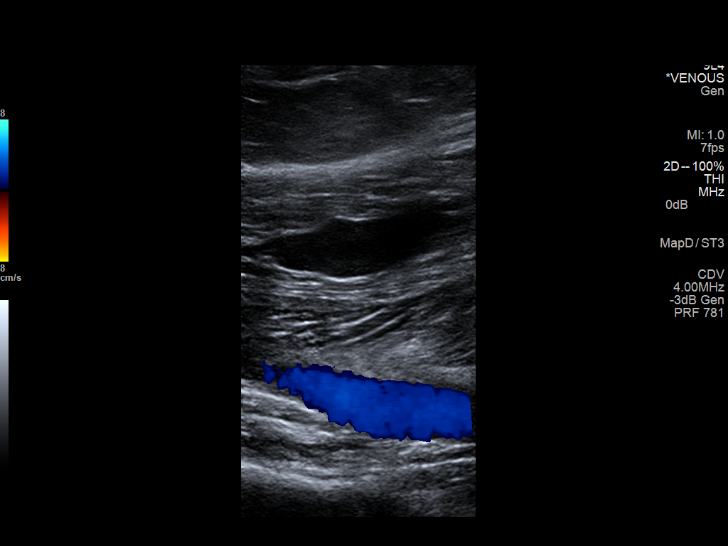
[im 13/30]
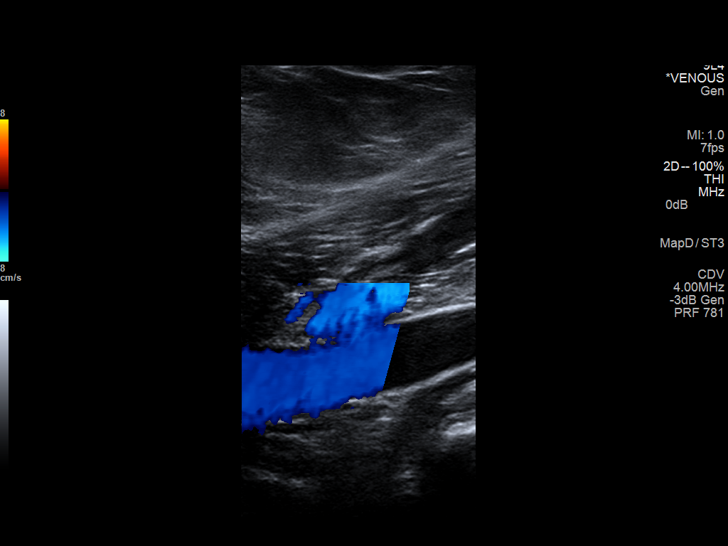
[im 16/30]
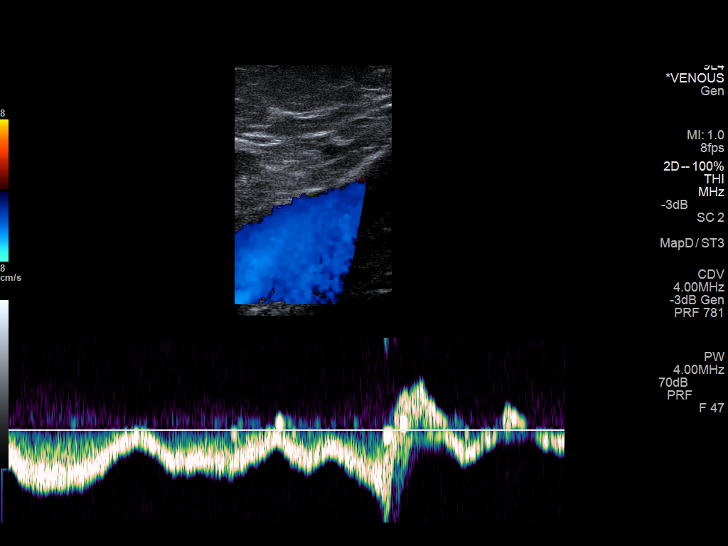
[im 17/30]
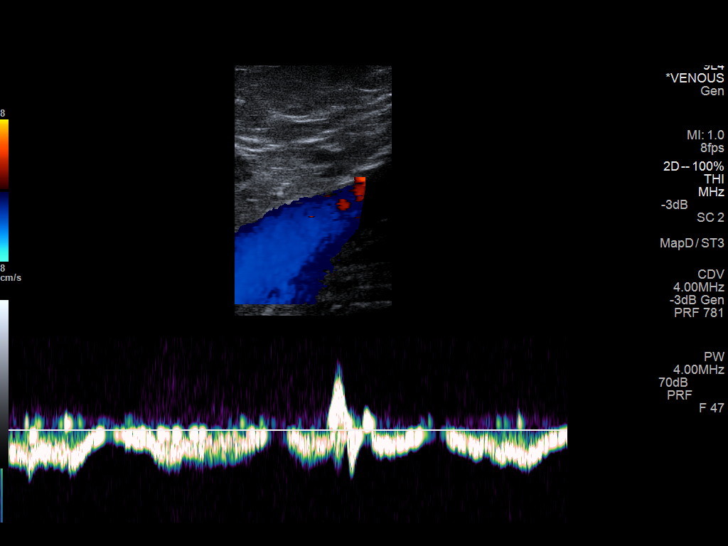
[im 19/30]
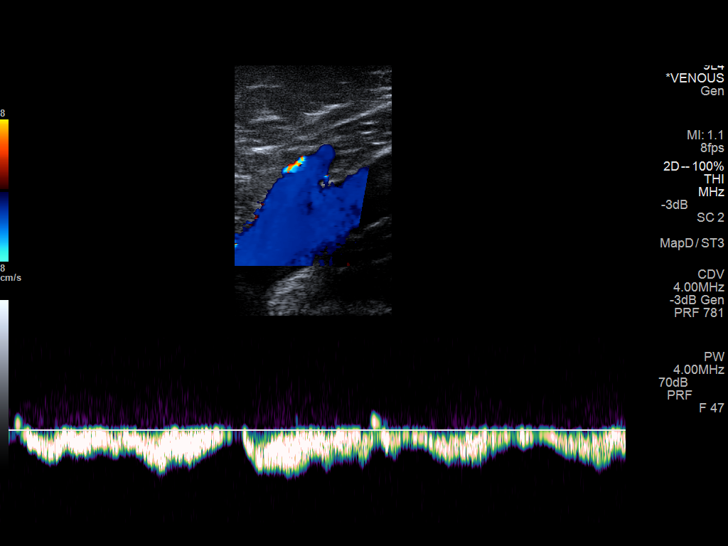
[im 22/30]
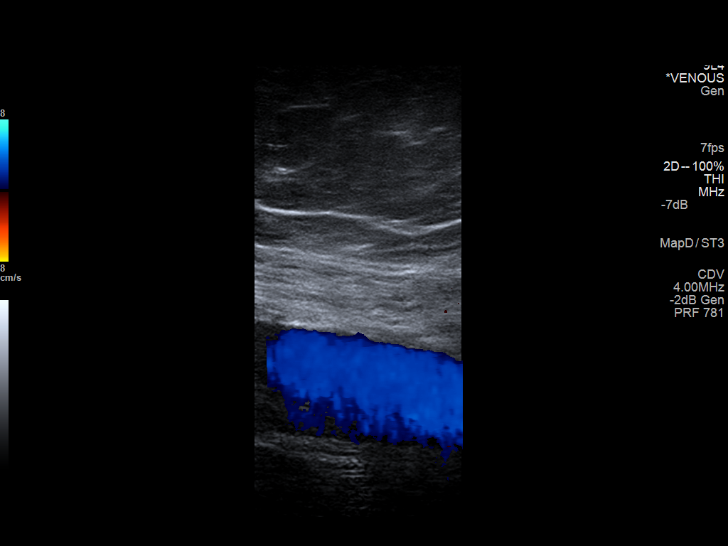
[im 24/30]
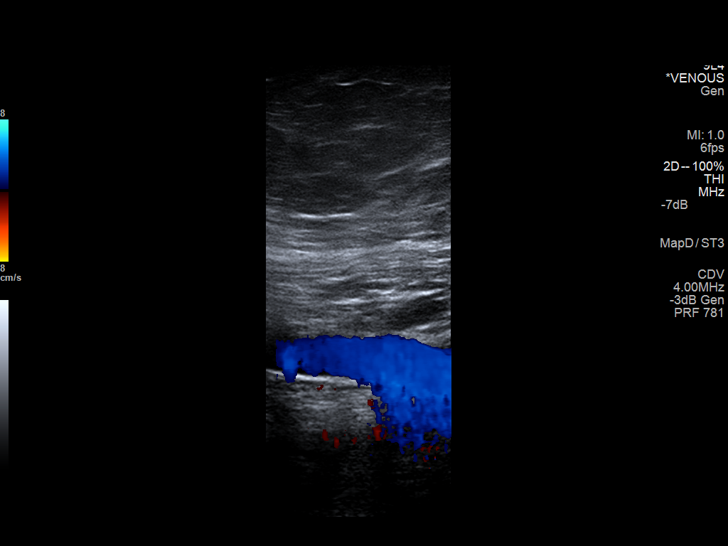
[im 27/30]
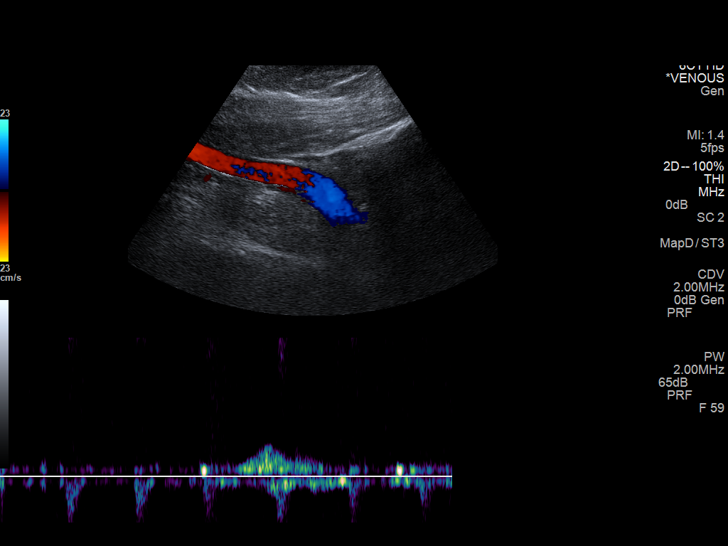
[im 30/30]
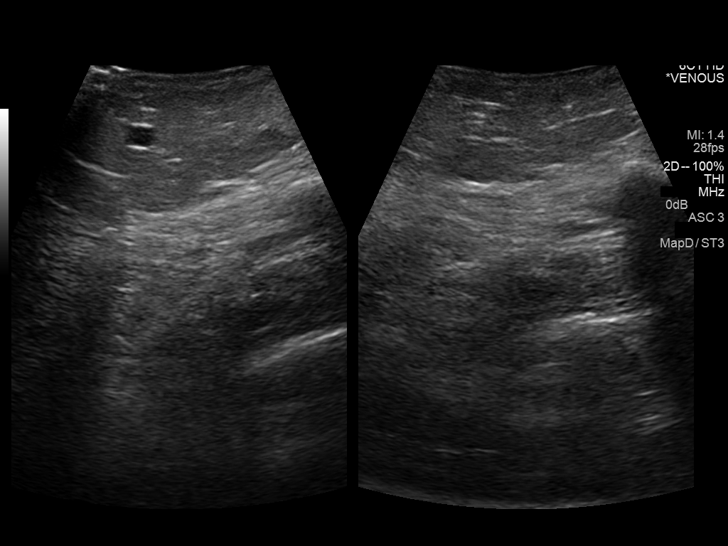

[13 of 24 positions shown; findings below may reference images not displayed]

FINDINGS: Contralateral Common Femoral Vein: Respiratory phasicity is normal
and symmetric with the symptomatic side. No evidence of thrombus.
Normal compressibility.

Common Femoral Vein: No evidence of thrombus. Normal
compressibility, respiratory phasicity and response to augmentation.

Saphenofemoral Junction: No evidence of thrombus. Normal
compressibility and flow on color Doppler imaging.

Profunda Femoral Vein: No evidence of thrombus. Normal
compressibility and flow on color Doppler imaging.

Femoral Vein: No evidence of thrombus. Normal compressibility,
respiratory phasicity and response to augmentation.

Popliteal Vein: No evidence of thrombus. Normal compressibility,
respiratory phasicity and response to augmentation.

Calf Veins: Limited evaluation.  No gross abnormality.

Superficial Great Saphenous Vein: No evidence of thrombus. Normal
compressibility and flow on color Doppler imaging.

Other Findings:  None.
IMPRESSION: No evidence of acute DVT in the left lower extremity. Examination is
mildly limited by body habitus.

## 2017-02-21 IMAGING — US US EXTREM LOW VENOUS*L*
1 series · 14 of 24 positions shown · non-contrast
Comparison: None.

CLINICAL DATA: Thrombophlebitis

EXAM:
LEFT LOWER EXTREMITY VENOUS DUPLEX ULTRASOUND
TECHNIQUE: Doppler venous assessment of the left lower extremity deep venous
system was performed, including characterization of spectral flow,
compressibility, and phasicity.

[Series 1: us extrem low venous*left* · 0.08mm/px · 14 of 83 slices shown]
[im 1/83]
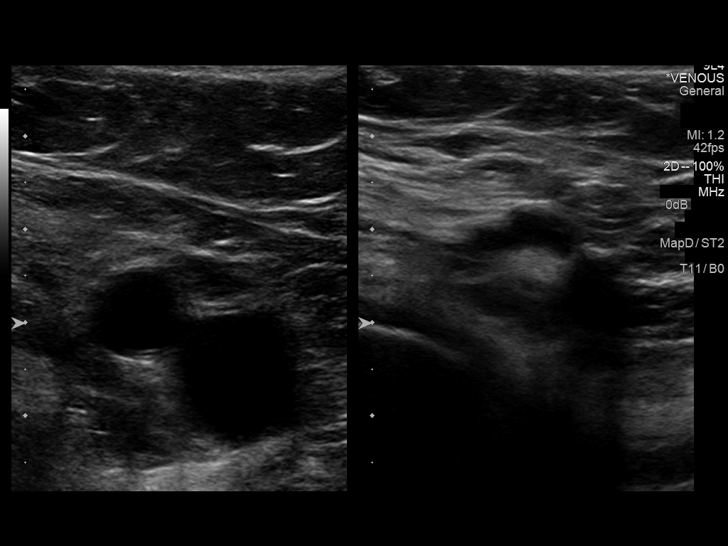
[im 8/83]
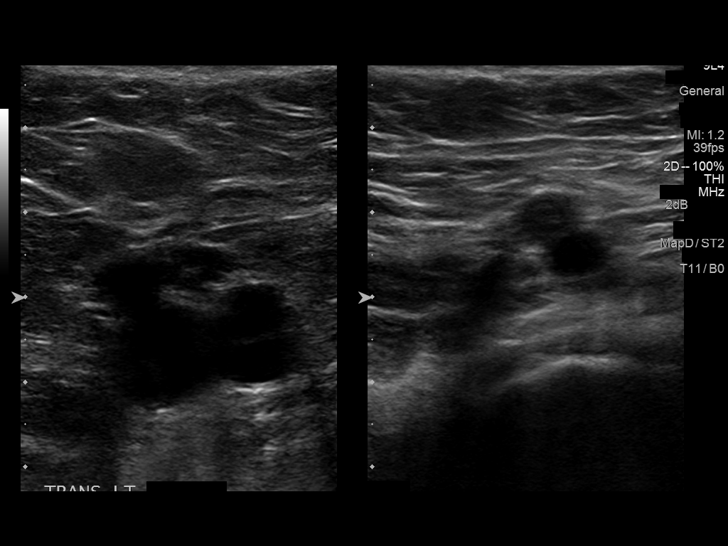
[im 15/83]
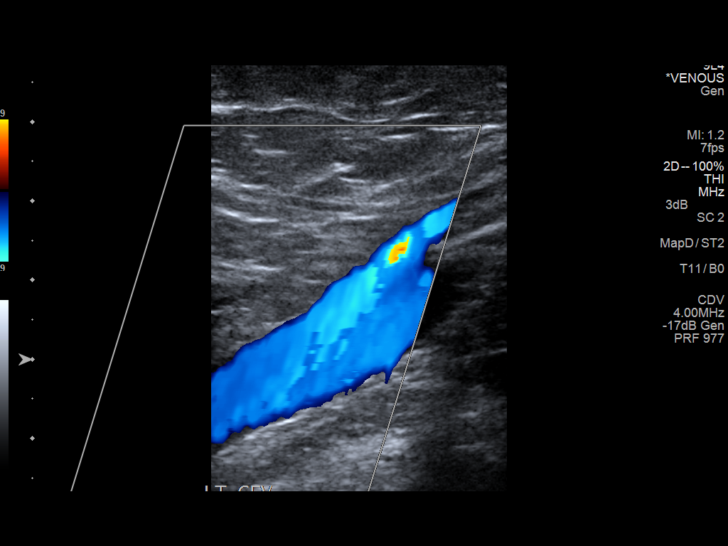
[im 22/83]
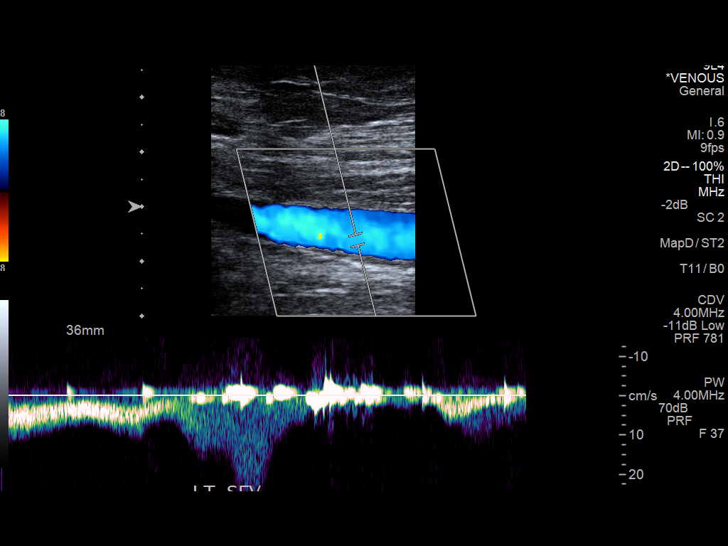
[im 25/83]
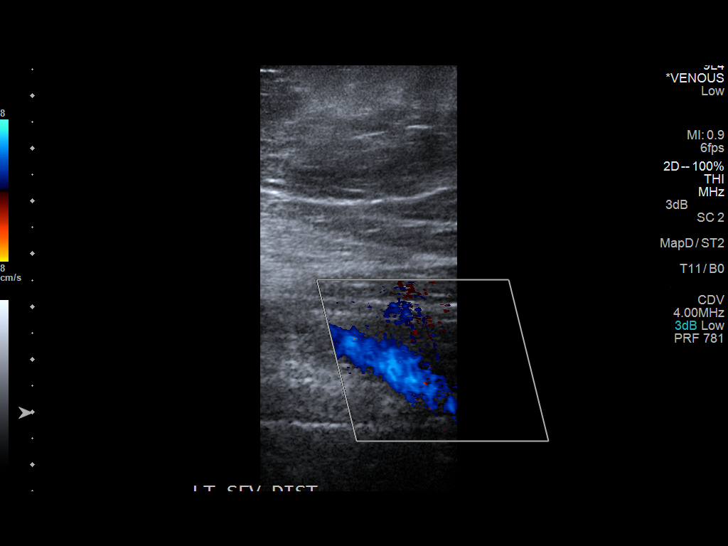
[im 33/83]
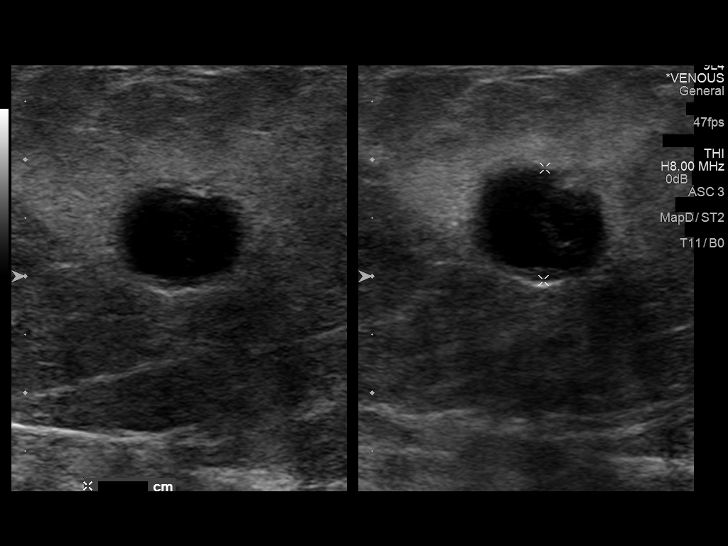
[im 40/83]
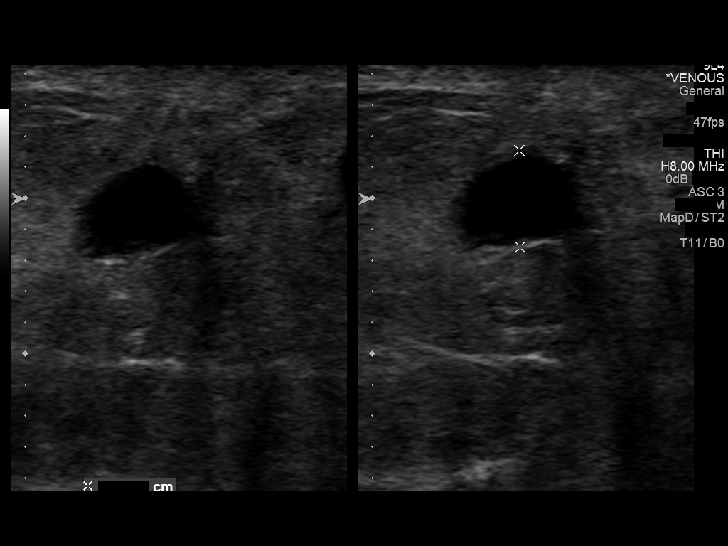
[im 43/83]
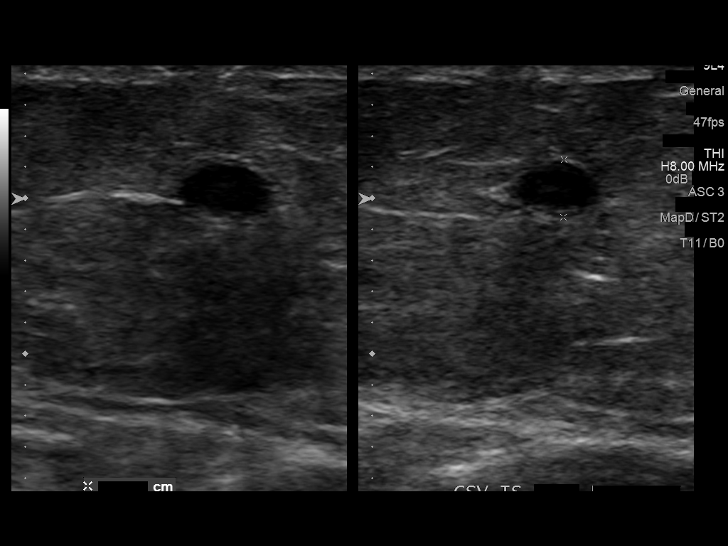
[im 50/83]
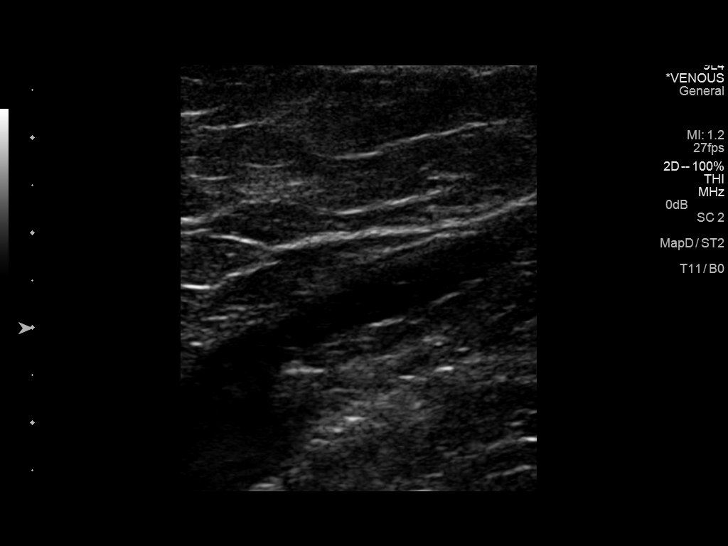
[im 58/83]
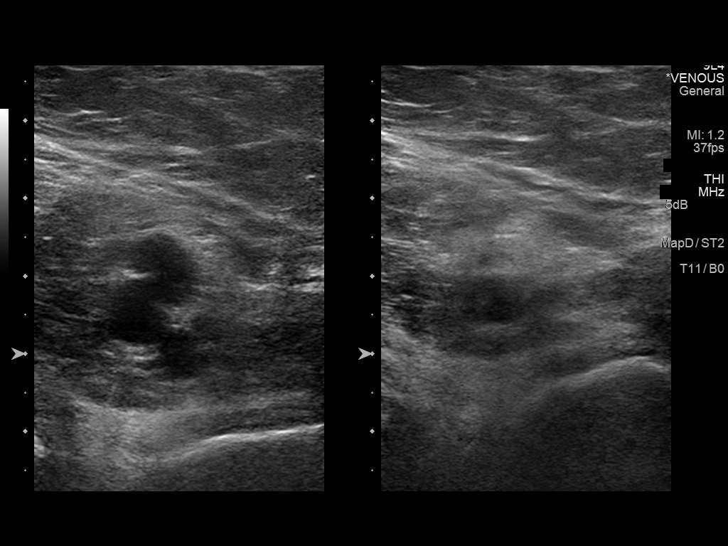
[im 65/83]
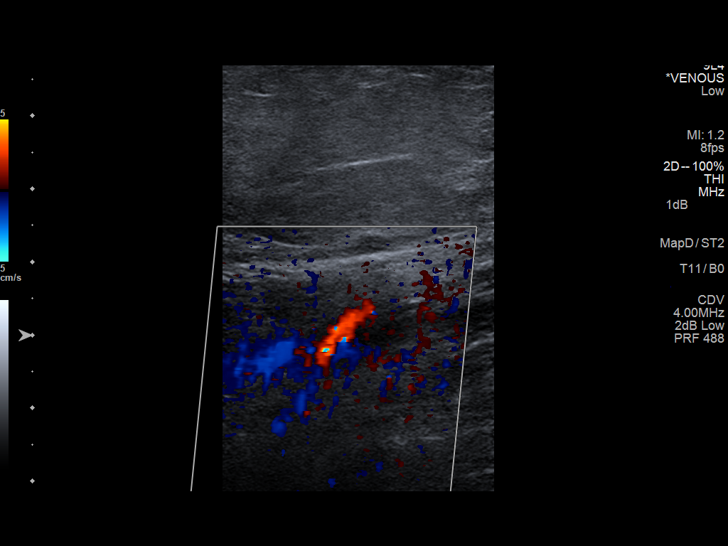
[im 68/83]
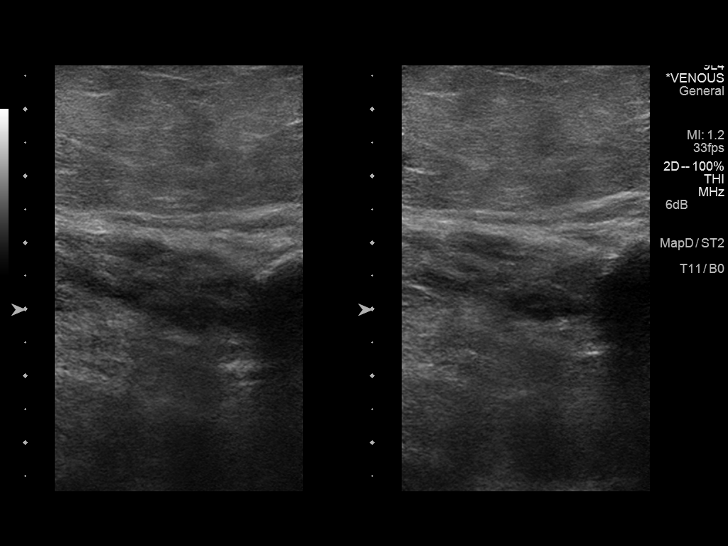
[im 75/83]
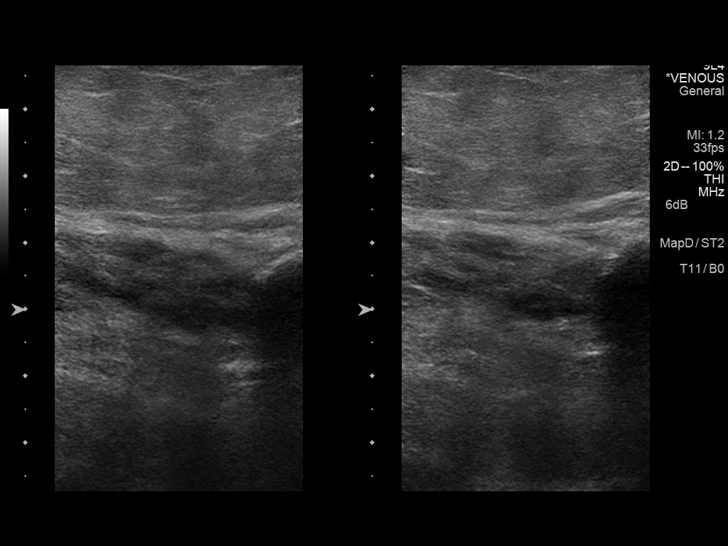
[im 83/83]
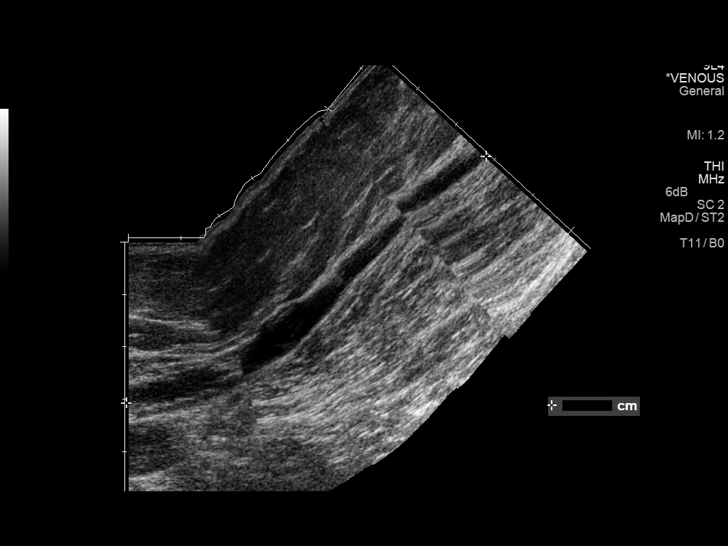

[14 of 24 positions shown; findings below may reference images not displayed]

FINDINGS: There is complete compressibility of the left common femoral,
femoral, and popliteal veins. Doppler analysis demonstrates
respiratory phasicity and augmentation of flow upon calf
compression. No obvious DVT in the calf. The greater saphenous vein
is thrombosed and noncompressible from the midthigh to the lower
calf.
IMPRESSION: No evidence of left lower extremity DVT.

Thrombosis of the greater saphenous vein.
